# Patient Record
Sex: Female | Born: 1950
Health system: Southern US, Community
[De-identification: ages and names within clinical notes are randomized; demographics above are authoritative.]

## PROBLEM LIST (undated history)

## (undated) DIAGNOSIS — M1990 Unspecified osteoarthritis, unspecified site: Secondary | ICD-10-CM

## (undated) DIAGNOSIS — I10 Essential (primary) hypertension: Secondary | ICD-10-CM

## (undated) DIAGNOSIS — C50919 Malignant neoplasm of unspecified site of unspecified female breast: Secondary | ICD-10-CM

## (undated) DIAGNOSIS — R188 Other ascites: Secondary | ICD-10-CM

## (undated) DIAGNOSIS — R0602 Shortness of breath: Secondary | ICD-10-CM

## (undated) DIAGNOSIS — Z8719 Personal history of other diseases of the digestive system: Secondary | ICD-10-CM

## (undated) DIAGNOSIS — E7800 Pure hypercholesterolemia, unspecified: Secondary | ICD-10-CM

## (undated) DIAGNOSIS — K219 Gastro-esophageal reflux disease without esophagitis: Secondary | ICD-10-CM

## (undated) DIAGNOSIS — E119 Type 2 diabetes mellitus without complications: Secondary | ICD-10-CM

## (undated) DIAGNOSIS — I890 Lymphedema, not elsewhere classified: Secondary | ICD-10-CM

## (undated) DIAGNOSIS — M4850XA Collapsed vertebra, not elsewhere classified, site unspecified, initial encounter for fracture: Secondary | ICD-10-CM

## (undated) DIAGNOSIS — D649 Anemia, unspecified: Secondary | ICD-10-CM

## (undated) HISTORY — DX: Pure hypercholesterolemia, unspecified: E78.00

## (undated) HISTORY — PX: EYE SURGERY: SHX253

## (undated) HISTORY — DX: Malignant neoplasm of unspecified site of unspecified female breast: C50.919

## (undated) HISTORY — DX: Other ascites: R18.8

## (undated) HISTORY — DX: Type 2 diabetes mellitus without complications: E11.9

## (undated) HISTORY — DX: Essential (primary) hypertension: I10

---

## 2001-12-06 HISTORY — PX: UTERINE FIBROID SURGERY: SHX826

## 2003-12-07 HISTORY — PX: MASTECTOMY, PARTIAL: SHX709

## 2003-12-09 DIAGNOSIS — C50912 Malignant neoplasm of unspecified site of left female breast: Secondary | ICD-10-CM | POA: Insufficient documentation

## 2003-12-16 DIAGNOSIS — Z170 Estrogen receptor positive status [ER+]: Secondary | ICD-10-CM | POA: Insufficient documentation

## 2004-07-13 ENCOUNTER — Ambulatory Visit: Admission: RE | Admit: 2004-07-13 | Discharge: 2004-09-17 | Payer: Self-pay | Admitting: *Deleted

## 2004-10-09 ENCOUNTER — Ambulatory Visit: Admission: RE | Admit: 2004-10-09 | Discharge: 2004-10-09 | Payer: Self-pay | Admitting: *Deleted

## 2004-12-14 ENCOUNTER — Ambulatory Visit: Payer: Self-pay | Admitting: Oncology

## 2005-04-06 ENCOUNTER — Ambulatory Visit: Payer: Self-pay | Admitting: Oncology

## 2005-07-14 ENCOUNTER — Ambulatory Visit: Payer: Self-pay | Admitting: Oncology

## 2005-12-08 ENCOUNTER — Ambulatory Visit: Payer: Self-pay | Admitting: Oncology

## 2006-03-17 ENCOUNTER — Ambulatory Visit: Payer: Self-pay | Admitting: Oncology

## 2006-06-03 ENCOUNTER — Ambulatory Visit: Payer: Self-pay | Admitting: Oncology

## 2006-12-08 ENCOUNTER — Ambulatory Visit: Payer: Self-pay | Admitting: Oncology

## 2012-05-23 ENCOUNTER — Ambulatory Visit: Payer: Medicare Other | Admitting: Gynecology

## 2012-05-31 ENCOUNTER — Encounter (HOSPITAL_COMMUNITY): Payer: Self-pay | Admitting: Pharmacy Technician

## 2012-05-31 ENCOUNTER — Encounter: Payer: Self-pay | Admitting: *Deleted

## 2012-06-02 ENCOUNTER — Ambulatory Visit: Payer: Medicare Other | Attending: Gynecology | Admitting: Gynecology

## 2012-06-02 ENCOUNTER — Encounter: Payer: Self-pay | Admitting: Gynecology

## 2012-06-02 ENCOUNTER — Encounter (HOSPITAL_COMMUNITY): Payer: Self-pay

## 2012-06-02 ENCOUNTER — Encounter (HOSPITAL_COMMUNITY)
Admission: RE | Admit: 2012-06-02 | Discharge: 2012-06-02 | Disposition: A | Discharge status: Home / Self Care | Payer: Medicare Other | Source: Ambulatory Visit | Attending: Obstetrics & Gynecology | Admitting: Obstetrics & Gynecology

## 2012-06-02 ENCOUNTER — Ambulatory Visit (HOSPITAL_COMMUNITY)
Admission: RE | Admit: 2012-06-02 | Discharge: 2012-06-02 | Disposition: A | Discharge status: Home / Self Care | Payer: Medicare Other | Source: Ambulatory Visit | Attending: Gynecology | Admitting: Gynecology

## 2012-06-02 VITALS — BP 120/70 | HR 64 | Temp 98.1°F | Resp 16 | Ht 65.35 in | Wt 185.0 lb

## 2012-06-02 DIAGNOSIS — Z90710 Acquired absence of both cervix and uterus: Secondary | ICD-10-CM | POA: Insufficient documentation

## 2012-06-02 DIAGNOSIS — C801 Malignant (primary) neoplasm, unspecified: Secondary | ICD-10-CM | POA: Insufficient documentation

## 2012-06-02 DIAGNOSIS — Z87891 Personal history of nicotine dependence: Secondary | ICD-10-CM | POA: Insufficient documentation

## 2012-06-02 DIAGNOSIS — Z7982 Long term (current) use of aspirin: Secondary | ICD-10-CM | POA: Insufficient documentation

## 2012-06-02 DIAGNOSIS — I447 Left bundle-branch block, unspecified: Secondary | ICD-10-CM | POA: Insufficient documentation

## 2012-06-02 DIAGNOSIS — J910 Malignant pleural effusion: Secondary | ICD-10-CM | POA: Insufficient documentation

## 2012-06-02 DIAGNOSIS — R978 Other abnormal tumor markers: Secondary | ICD-10-CM

## 2012-06-02 DIAGNOSIS — I10 Essential (primary) hypertension: Secondary | ICD-10-CM | POA: Insufficient documentation

## 2012-06-02 DIAGNOSIS — Z8049 Family history of malignant neoplasm of other genital organs: Secondary | ICD-10-CM | POA: Insufficient documentation

## 2012-06-02 DIAGNOSIS — Z01818 Encounter for other preprocedural examination: Secondary | ICD-10-CM | POA: Insufficient documentation

## 2012-06-02 DIAGNOSIS — Z01812 Encounter for preprocedural laboratory examination: Secondary | ICD-10-CM | POA: Insufficient documentation

## 2012-06-02 DIAGNOSIS — Z01810 Encounter for preprocedural cardiovascular examination: Secondary | ICD-10-CM | POA: Insufficient documentation

## 2012-06-02 DIAGNOSIS — E7800 Pure hypercholesterolemia, unspecified: Secondary | ICD-10-CM | POA: Insufficient documentation

## 2012-06-02 DIAGNOSIS — Z853 Personal history of malignant neoplasm of breast: Secondary | ICD-10-CM | POA: Insufficient documentation

## 2012-06-02 DIAGNOSIS — R180 Malignant ascites: Secondary | ICD-10-CM | POA: Insufficient documentation

## 2012-06-02 DIAGNOSIS — J90 Pleural effusion, not elsewhere classified: Secondary | ICD-10-CM | POA: Insufficient documentation

## 2012-06-02 DIAGNOSIS — Z800 Family history of malignant neoplasm of digestive organs: Secondary | ICD-10-CM | POA: Insufficient documentation

## 2012-06-02 DIAGNOSIS — J9819 Other pulmonary collapse: Secondary | ICD-10-CM | POA: Insufficient documentation

## 2012-06-02 HISTORY — DX: Lymphedema, not elsewhere classified: I89.0

## 2012-06-02 HISTORY — DX: Personal history of other diseases of the digestive system: Z87.19

## 2012-06-02 HISTORY — DX: Gastro-esophageal reflux disease without esophagitis: K21.9

## 2012-06-02 HISTORY — DX: Unspecified osteoarthritis, unspecified site: M19.90

## 2012-06-02 HISTORY — DX: Anemia, unspecified: D64.9

## 2012-06-02 HISTORY — DX: Collapsed vertebra, not elsewhere classified, site unspecified, initial encounter for fracture: M48.50XA

## 2012-06-02 HISTORY — DX: Shortness of breath: R06.02

## 2012-06-02 LAB — COMPREHENSIVE METABOLIC PANEL
Alkaline Phosphatase: 78 U/L (ref 39–117)
BUN: 9 mg/dL (ref 6–23)
GFR calc Af Amer: >90 mL/min (ref >90)
Glucose, Bld: 120 mg/dL — ABNORMAL HIGH (ref 70–99)
Potassium: 4.1 mEq/L (ref 3.5–5.1)
Total Protein: 6.5 g/dL (ref 6.0–8.3)

## 2012-06-02 LAB — CBC
HCT: 34.3 % — ABNORMAL LOW (ref 36.0–46.0)
Hemoglobin: 10.7 g/dL — ABNORMAL LOW (ref 12.0–15.0)
MCH: 26.2 pg (ref 26.0–34.0)
MCHC: 31.2 g/dL (ref 30.0–36.0)
MCV: 84.1 fL (ref 78.0–100.0)

## 2012-06-02 LAB — DIFFERENTIAL
Basophils Relative: 0 % (ref 0–1)
Eosinophils Absolute: 0 10*3/uL (ref 0.0–0.7)
Lymphs Abs: 0.5 10*3/uL — ABNORMAL LOW (ref 0.7–4.0)
Neutrophils Relative %: 87 % — ABNORMAL HIGH (ref 43–77)

## 2012-06-02 NOTE — Pre-Procedure Instructions (Signed)
06/02/12 Telford Nab RN made aware of CXR results of 06/02/12.  Will let anesthesia be aware of CXR result.

## 2012-06-02 NOTE — Pre-Procedure Instructions (Signed)
Teach Back Method of teaching used for preop appointment on 06/02/12.   

## 2012-06-02 NOTE — Patient Instructions (Addendum)
20 [name] [name]  06/02/2012   Your procedure is scheduled on:  06/06/12 [id number]  Report to [facility] at 0515 AM.  Call this number if you have problems the morning of surgery: [phone]   Remember:   Do not eat food:After Midnight.  May have clear liquids:until Midnight .  [name]  Take these medicines the morning of surgery with A SIP OF WATER:    Do not wear jewelry, make-up or nail polish.  Do not wear lotions, powders, or perfumes.  Do not shave 48 hours prior to surgery..  Do not bring valuables to the hospital.  Contacts, dentures or bridgework may not be worn into surgery.  Leave suitcase in the car. After surgery it may be brought to your room.  For patients admitted to the hospital, checkout time is 11:00 AM the day of discharge.     Special Instructions: CHG Shower Use Special Wash: 1/2 bottle night before surgery and 1/2 bottle morning of surgery. shower chin to toes with CHG.  Wash face and private parts with regular soap.    Please read over the following fact sheets that you were given: MRSA Information, Incentive Spirometry Fact Sheet, Blood Transfusion Fact sheet, coughing and deep breathing exercises, leg exercises

## 2012-06-02 NOTE — Progress Notes (Signed)
Consult Note: Gyn-Onc   [name] [name] 61 y.o. female  Chief Complaint  Patient presents with  . Ascites, Elevated tumor marker    New pt       HPI:   61-year-old white female seen in consultation request of Dr. Christine [name] of [place] regarding management of new onset of ascites, malignant pleural effusion, and an omental cake. Patient reports that shortly after Memorial Day she developed rapid onset of abdominal distention. A CT scan has been obtained showing pleural effusion, significant abdominal ascites, an omental cake and carcinomatosis. A liter of pleural effusion is been removed as well as a liter and a half from the abdomen. Cytology showed malignant cells consistent with adenocarcinoma. Patient CA 125 value is 656 units per mL CA 27.29 is 208 units per mL CEA is less than 0.3.  The patient is not short of breath although she feels that the abdominal distention is pushing up on her diaphragms. Her appetite has been poor. She has no other GI or GU symptoms.  Is noted that she has a history of breast cancer treated 8 years ago.  Review of Systems:10 point review of systems is negative as noted above.   Vitals: Blood pressure 120/70, pulse 64, temperature 98.1 F (36.7 C), temperature source Oral, resp. rate 16, height 5' 5.35" (1.66 m), weight 185 lb (83.915 kg).  Physical Exam: General : The patient is a healthy woman in no acute distress.  HEENT: normocephalic, extraoccular movements normal; neck is supple without thyromegally  Lynphnodes: Supraclavicular and inguinal nodes not enlarged  Abdomen: Massively distended with ascites. The patient shifting dullness and a fluid wave. I am unable to palpate an omental cake.  Pelvic:  EGBUS: Normal female  Vagina: Normal, no lesions is a polyp in the left vaginal fornix Urethra and Bladder: Normal, non-tender  Cervix: Normal  Uterus: Difficult to outline due to the abdominal distention  Bi-manual examination: nodularity in the  posterior cul-de-sac. Am unable to  detect any other masses although they may be hidden by the patient's abdominal distention. Rectal: normal sphincter tone, no masses, no blood  Lower extremities: 1+ ankle edema bilaterally. No varicosities. 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 I would recommend the patient undergo surgical debulking including omentectomy, total abdominal hysterectomy, bilateral salpingo-oophorectomy, and resection of any other cancer possible. I discussed this with the patient and her husband and their agreement. They understand that postoperatively, once we have are final pathology reports back, that chemotherapy will be also recommended. All her questions are answered. She'll undergo preoperative testing today and we schedule surgery for next Tuesday.  No Known Allergies  Past Medical History  Diagnosis Date  . Breast cancer   . Hypertension   . Hypercholesteremia   . Ascites     Past Surgical History  Procedure Date  . Mastectomy, partial Jan 2005    Left  . Uterine fibroid surgery 2003    Current Outpatient Prescriptions  Medication Sig Dispense Refill  . aspirin 81 MG chewable tablet Chew 81 mg by mouth every other day.      . cetirizine (ZYRTEC) 10 MG tablet Take 10 mg by mouth as needed.      . losartan (COZAAR) 100 MG tablet Take 100 mg by mouth daily with breakfast.      . metoprolol (LOPRESSOR) 50 MG tablet Take 50 mg by   mouth 2 (two) times daily.      . simvastatin (ZOCOR) 40 MG tablet Take 40 mg by mouth every evening.        History   Social History  . Marital Status: Married    Spouse Name: N/A    Number of Children: N/A  . Years of Education: N/A   Occupational History  . Not on file.   Social History Main Topics  . Smoking status: Former Smoker    Quit date: 12/06/2002  . Smokeless  tobacco: Not on file  . Alcohol Use: No  . Drug Use: No  . Sexually Active: Not on file   Other Topics Concern  . Not on file   Social History Narrative  . No narrative on file    Family History  Problem Relation Age of Onset  . Colon cancer Mother   . Uterine cancer Sister   . Uterine cancer Daughter       [name],[name] Klahn L, MD 06/02/2012, 9:57 AM         

## 2012-06-02 NOTE — Patient Instructions (Signed)
We will undertake preoperative testing today. Surgery is scheduled for Tuesday, July 2.

## 2012-06-02 NOTE — Progress Notes (Signed)
06/02/12 Dr Acey Lav made aware of CXR results.  Also aware sat 97 on room air.  Occasional nonproductive cough.  No shortness of breath noted on preop appointment.  No further orders given.

## 2012-06-05 MED ORDER — DEXTROSE 5 % IV SOLN
2.0000 g | INTRAVENOUS | Status: AC
  Administered 2012-06-06: 2 g via INTRAVENOUS
  Filled 2012-06-05 (×2): qty 2

## 2012-06-06 ENCOUNTER — Encounter (HOSPITAL_COMMUNITY): Payer: Self-pay | Admitting: *Deleted

## 2012-06-06 ENCOUNTER — Encounter (HOSPITAL_COMMUNITY): Payer: Self-pay | Admitting: Anesthesiology

## 2012-06-06 ENCOUNTER — Encounter (HOSPITAL_COMMUNITY)
Admission: RE | Disposition: A | Discharge status: Home / Self Care | Payer: Self-pay | Source: Ambulatory Visit | Attending: Obstetrics & Gynecology

## 2012-06-06 ENCOUNTER — Ambulatory Visit (HOSPITAL_COMMUNITY): Payer: Medicare Other | Admitting: Anesthesiology

## 2012-06-06 ENCOUNTER — Inpatient Hospital Stay (HOSPITAL_COMMUNITY)
Admission: RE | Admit: 2012-06-06 | Discharge: 2012-06-09 | DRG: 357 | Disposition: A | Discharge status: Home / Self Care | Payer: Medicare Other | Source: Ambulatory Visit | Attending: Obstetrics & Gynecology | Admitting: Obstetrics & Gynecology

## 2012-06-06 DIAGNOSIS — Z7982 Long term (current) use of aspirin: Secondary | ICD-10-CM

## 2012-06-06 DIAGNOSIS — C482 Malignant neoplasm of peritoneum, unspecified: Principal | ICD-10-CM | POA: Diagnosis present

## 2012-06-06 DIAGNOSIS — Z79899 Other long term (current) drug therapy: Secondary | ICD-10-CM

## 2012-06-06 DIAGNOSIS — Z853 Personal history of malignant neoplasm of breast: Secondary | ICD-10-CM

## 2012-06-06 DIAGNOSIS — E7800 Pure hypercholesterolemia, unspecified: Secondary | ICD-10-CM | POA: Diagnosis present

## 2012-06-06 DIAGNOSIS — E871 Hypo-osmolality and hyponatremia: Secondary | ICD-10-CM | POA: Diagnosis not present

## 2012-06-06 DIAGNOSIS — R180 Malignant ascites: Secondary | ICD-10-CM | POA: Diagnosis present

## 2012-06-06 DIAGNOSIS — C569 Malignant neoplasm of unspecified ovary: Secondary | ICD-10-CM | POA: Diagnosis present

## 2012-06-06 DIAGNOSIS — I10 Essential (primary) hypertension: Secondary | ICD-10-CM | POA: Diagnosis present

## 2012-06-06 HISTORY — PX: SALPINGOOPHORECTOMY: SHX82

## 2012-06-06 HISTORY — PX: LAPAROTOMY: SHX154

## 2012-06-06 HISTORY — PX: APPENDECTOMY: SHX54

## 2012-06-06 SURGERY — LAPAROTOMY, EXPLORATORY
Anesthesia: General | Wound class: Clean Contaminated

## 2012-06-06 MED ORDER — PROPOFOL 10 MG/ML IV BOLUS
INTRAVENOUS | Status: DC | PRN
  Administered 2012-06-06: 100 mg via INTRAVENOUS

## 2012-06-06 MED ORDER — PROMETHAZINE HCL 25 MG/ML IJ SOLN: 6.2500 mg | INTRAMUSCULAR | Status: DC | PRN

## 2012-06-06 MED ORDER — ZOLPIDEM TARTRATE 5 MG PO TABS: 5.0000 mg | ORAL_TABLET | Freq: Every evening | ORAL | Status: DC | PRN

## 2012-06-06 MED ORDER — NEOSTIGMINE METHYLSULFATE 1 MG/ML IJ SOLN
INTRAMUSCULAR | Status: DC | PRN
  Administered 2012-06-06: 4 mg via INTRAVENOUS

## 2012-06-06 MED ORDER — DEXAMETHASONE SODIUM PHOSPHATE 10 MG/ML IJ SOLN
INTRAMUSCULAR | Status: DC | PRN
  Administered 2012-06-06: 10 mg via INTRAVENOUS

## 2012-06-06 MED ORDER — DIPHENHYDRAMINE HCL 12.5 MG/5ML PO ELIX: 12.5000 mg | ORAL_SOLUTION | Freq: Four times a day (QID) | ORAL | Status: DC | PRN

## 2012-06-06 MED ORDER — GLYCOPYRROLATE 0.2 MG/ML IJ SOLN
INTRAMUSCULAR | Status: DC | PRN
  Administered 2012-06-06: .5 mg via INTRAVENOUS

## 2012-06-06 MED ORDER — LOSARTAN POTASSIUM 50 MG PO TABS
100.0000 mg | ORAL_TABLET | Freq: Every day | ORAL | Status: DC
  Administered 2012-06-07 – 2012-06-09 (×3): 100 mg via ORAL
  Filled 2012-06-06 (×3): qty 2

## 2012-06-06 MED ORDER — KCL IN DEXTROSE-NACL 20-5-0.45 MEQ/L-%-% IV SOLN
INTRAVENOUS | Status: AC
  Administered 2012-06-06: 1000 mL
  Filled 2012-06-06: qty 1000

## 2012-06-06 MED ORDER — ONDANSETRON HCL 4 MG/2ML IJ SOLN
4.0000 mg | Freq: Four times a day (QID) | INTRAMUSCULAR | Status: DC | PRN
  Administered 2012-06-07: 4 mg via INTRAVENOUS
  Filled 2012-06-06: qty 2

## 2012-06-06 MED ORDER — KCL IN DEXTROSE-NACL 20-5-0.45 MEQ/L-%-% IV SOLN
INTRAVENOUS | Status: DC
Start: 2012-06-06 — End: 2012-06-07
  Administered 2012-06-06 – 2012-06-07 (×2): via INTRAVENOUS
  Filled 2012-06-06 (×4): qty 1000

## 2012-06-06 MED ORDER — PHENOL 1.4 % MT LIQD
1.0000 | OROMUCOSAL | Status: DC | PRN
  Filled 2012-06-06: qty 177

## 2012-06-06 MED ORDER — HETASTARCH-ELECTROLYTES 6 % IV SOLN
INTRAVENOUS | Status: DC | PRN
  Administered 2012-06-06: 08:00:00 via INTRAVENOUS

## 2012-06-06 MED ORDER — FENTANYL CITRATE 0.05 MG/ML IJ SOLN
INTRAMUSCULAR | Status: DC | PRN
  Administered 2012-06-06: 50 ug via INTRAVENOUS
  Administered 2012-06-06: 100 ug via INTRAVENOUS
  Administered 2012-06-06 (×2): 50 ug via INTRAVENOUS

## 2012-06-06 MED ORDER — HYDROMORPHONE 0.3 MG/ML IV SOLN
INTRAVENOUS | Status: DC
  Administered 2012-06-06: 10:00:00 via INTRAVENOUS
  Administered 2012-06-06: 0.3 mg via INTRAVENOUS
  Administered 2012-06-06: 0.6 mg via INTRAVENOUS
  Administered 2012-06-06 – 2012-06-07 (×2): 0.3 mg via INTRAVENOUS

## 2012-06-06 MED ORDER — ENOXAPARIN SODIUM 40 MG/0.4ML ~~LOC~~ SOLN
40.0000 mg | SUBCUTANEOUS | Status: AC
  Administered 2012-06-06: 40 mg via SUBCUTANEOUS

## 2012-06-06 MED ORDER — HYDROMORPHONE HCL PF 1 MG/ML IJ SOLN
INTRAMUSCULAR | Status: AC
  Filled 2012-06-06: qty 1

## 2012-06-06 MED ORDER — SODIUM CHLORIDE 0.9 % IJ SOLN: 9.0000 mL | INTRAMUSCULAR | Status: DC | PRN

## 2012-06-06 MED ORDER — ENOXAPARIN SODIUM 40 MG/0.4ML ~~LOC~~ SOLN
SUBCUTANEOUS | Status: AC
  Filled 2012-06-06: qty 0.4

## 2012-06-06 MED ORDER — MAGNESIUM HYDROXIDE 400 MG/5ML PO SUSP
30.0000 mL | Freq: Three times a day (TID) | ORAL | Status: DC
  Administered 2012-06-06 – 2012-06-07 (×2): 30 mL via ORAL
  Filled 2012-06-06 (×2): qty 30

## 2012-06-06 MED ORDER — SUCCINYLCHOLINE CHLORIDE 20 MG/ML IJ SOLN
INTRAMUSCULAR | Status: DC | PRN
  Administered 2012-06-06: 100 mg via INTRAVENOUS

## 2012-06-06 MED ORDER — ONDANSETRON HCL 4 MG PO TABS: 4.0000 mg | ORAL_TABLET | Freq: Four times a day (QID) | ORAL | Status: DC | PRN

## 2012-06-06 MED ORDER — NALOXONE HCL 0.4 MG/ML IJ SOLN: 0.4000 mg | INTRAMUSCULAR | Status: DC | PRN

## 2012-06-06 MED ORDER — DIPHENHYDRAMINE HCL 50 MG/ML IJ SOLN: 12.5000 mg | Freq: Four times a day (QID) | INTRAMUSCULAR | Status: DC | PRN

## 2012-06-06 MED ORDER — HYDROMORPHONE 0.3 MG/ML IV SOLN
INTRAVENOUS | Status: AC
  Filled 2012-06-06: qty 25

## 2012-06-06 MED ORDER — BUPIVACAINE LIPOSOME 1.3 % IJ SUSP
20.0000 mL | INTRAMUSCULAR | Status: AC
  Administered 2012-06-06: 40 mL
  Filled 2012-06-06: qty 20

## 2012-06-06 MED ORDER — ONDANSETRON HCL 4 MG/2ML IJ SOLN
INTRAMUSCULAR | Status: DC | PRN
  Administered 2012-06-06: 4 mg via INTRAVENOUS

## 2012-06-06 MED ORDER — OXYCODONE-ACETAMINOPHEN 5-325 MG PO TABS: 1.0000 | ORAL_TABLET | ORAL | Status: DC | PRN

## 2012-06-06 MED ORDER — HYDROMORPHONE HCL PF 1 MG/ML IJ SOLN: 0.2500 mg | INTRAMUSCULAR | Status: DC | PRN

## 2012-06-06 MED ORDER — CISATRACURIUM BESYLATE (PF) 10 MG/5ML IV SOLN
INTRAVENOUS | Status: DC | PRN
  Administered 2012-06-06 (×3): 4 mg via INTRAVENOUS

## 2012-06-06 MED ORDER — 0.9 % SODIUM CHLORIDE (POUR BTL) OPTIME
TOPICAL | Status: DC | PRN
  Administered 2012-06-06: 2000 mL

## 2012-06-06 MED ORDER — ONDANSETRON HCL 4 MG/2ML IJ SOLN: 4.0000 mg | Freq: Four times a day (QID) | INTRAMUSCULAR | Status: DC | PRN

## 2012-06-06 MED ORDER — METOPROLOL TARTRATE 50 MG PO TABS
50.0000 mg | ORAL_TABLET | Freq: Two times a day (BID) | ORAL | Status: DC
  Administered 2012-06-07 – 2012-06-09 (×5): 50 mg via ORAL
  Filled 2012-06-06 (×7): qty 1

## 2012-06-06 MED ORDER — LOSARTAN POTASSIUM 50 MG PO TABS: 100.0000 mg | ORAL_TABLET | Freq: Every day | ORAL | Status: DC

## 2012-06-06 MED ORDER — LACTATED RINGERS IV SOLN
INTRAVENOUS | Status: DC | PRN
  Administered 2012-06-06: 08:00:00 via INTRAVENOUS

## 2012-06-06 MED ORDER — MIDAZOLAM HCL 5 MG/5ML IJ SOLN
INTRAMUSCULAR | Status: DC | PRN
  Administered 2012-06-06: 2 mg via INTRAVENOUS

## 2012-06-06 MED ORDER — LACTATED RINGERS IV SOLN
INTRAVENOUS | Status: DC
Start: 2012-06-06 — End: 2012-06-06
  Administered 2012-06-06 (×2): via INTRAVENOUS

## 2012-06-06 SURGICAL SUPPLY — 49 items
ATTRACTOMAT 16X20 MAGNETIC DRP (DRAPES) ×4 IMPLANT
BAG URINE DRAINAGE (UROLOGICAL SUPPLIES) ×3 IMPLANT
BLADE EXTENDED COATED 6.5IN (ELECTRODE) ×6 IMPLANT
CANISTER SUCTION 2500CC (MISCELLANEOUS) ×4 IMPLANT
CHLORAPREP W/TINT 10.5 ML (MISCELLANEOUS) ×4 IMPLANT
CLIP TI MEDIUM LARGE 6 (CLIP) ×19 IMPLANT
CLOTH BEACON ORANGE TIMEOUT ST (SAFETY) ×4 IMPLANT
COVER SURGICAL LIGHT HANDLE (MISCELLANEOUS) ×4 IMPLANT
DRAPE UTILITY 15X26 (DRAPE) ×4 IMPLANT
DRAPE UTILITY XL STRL (DRAPES) ×4 IMPLANT
DRAPE WARM FLUID 44X44 (DRAPE) ×4 IMPLANT
DRSG TELFA 4X14 ISLAND ADH (GAUZE/BANDAGES/DRESSINGS) ×2 IMPLANT
ELECT BLADE 6.5 EXT (BLADE) ×4 IMPLANT
ELECT REM PT RETURN 9FT ADLT (ELECTROSURGICAL) ×4
ELECTRODE REM PT RTRN 9FT ADLT (ELECTROSURGICAL) ×3 IMPLANT
GAUZE SPONGE 4X4 16PLY XRAY LF (GAUZE/BANDAGES/DRESSINGS) ×2 IMPLANT
GLOVE BIO SURGEON STRL SZ 6.5 (GLOVE) ×4 IMPLANT
GLOVE BIO SURGEON STRL SZ7.5 (GLOVE) ×12 IMPLANT
GLOVE BIOGEL M STRL SZ7.5 (GLOVE) ×19 IMPLANT
GOWN PREVENTION PLUS LG XLONG (DISPOSABLE) ×5 IMPLANT
GOWN PREVENTION PLUS XLARGE (GOWN DISPOSABLE) ×4 IMPLANT
GOWN STRL NON-REIN LRG LVL3 (GOWN DISPOSABLE) ×4 IMPLANT
GOWN STRL REIN XL XLG (GOWN DISPOSABLE) ×4 IMPLANT
NS IRRIG 1000ML POUR BTL (IV SOLUTION) ×14 IMPLANT
PACK ABDOMINAL WL (CUSTOM PROCEDURE TRAY) ×4 IMPLANT
SEALER TISSUE X1 CVD JAW (INSTRUMENTS) ×3 IMPLANT
SHEET LAVH (DRAPES) ×4 IMPLANT
SPONGE LAP 18X18 X RAY DECT (DISPOSABLE) ×7 IMPLANT
STAPLER SKIN PROX WIDE 3.9 (STAPLE) ×4 IMPLANT
STAPLER VISISTAT 35W (STAPLE) ×4 IMPLANT
SUCTION POOLE TIP (SUCTIONS) ×2 IMPLANT
SUT ETHILON 1 LR 30 (SUTURE) IMPLANT
SUT PDS AB 1 CTXB1 36 (SUTURE) ×8 IMPLANT
SUT SILK 2 0 (SUTURE) ×4
SUT SILK 2 0 30  PSL (SUTURE) ×1
SUT SILK 2 0 30 PSL (SUTURE) ×1 IMPLANT
SUT SILK 2-0 18XBRD TIE 12 (SUTURE) ×3 IMPLANT
SUT VIC AB 0 CT1 36 (SUTURE) ×13 IMPLANT
SUT VIC AB 2-0 CT2 27 (SUTURE) ×27 IMPLANT
SUT VIC AB 2-0 SH 27 (SUTURE) ×20
SUT VIC AB 2-0 SH 27X BRD (SUTURE) ×17 IMPLANT
SUT VIC AB 3-0 CTX 36 (SUTURE) ×1 IMPLANT
SUT VICRYL 2 0 18  UND BR (SUTURE) ×1
SUT VICRYL 2 0 18 UND BR (SUTURE) ×3 IMPLANT
SYR 20CC LL (SYRINGE) ×1 IMPLANT
TOWEL OR 17X26 10 PK STRL BLUE (TOWEL DISPOSABLE) ×4 IMPLANT
TOWEL OR NON WOVEN STRL DISP B (DISPOSABLE) ×4 IMPLANT
TRAY FOLEY CATH 14FRSI W/METER (CATHETERS) ×4 IMPLANT
WATER STERILE IRR 1500ML POUR (IV SOLUTION) ×3 IMPLANT

## 2012-06-06 NOTE — Anesthesia Postprocedure Evaluation (Signed)
  Anesthesia Post-op Note  Patient: [name] [name]  Procedure(s) Performed: Procedure(s) (LRB): EXPLORATORY LAPAROTOMY (N/A) OMENTECTOMY () APPENDECTOMY () SALPINGO OOPHERECTOMY (Left)  Patient Location: PACU  Anesthesia Type: General  Level of Consciousness: awake and alert   Airway and Oxygen Therapy: Patient Spontanous Breathing  Post-op Pain: mild  Post-op Assessment: Post-op Vital signs reviewed, Patient's Cardiovascular Status Stable, Respiratory Function Stable, Patent Airway and No signs of Nausea or vomiting  Post-op Vital Signs: stable  Complications: No apparent anesthesia complications

## 2012-06-06 NOTE — Op Note (Signed)
[name] [name]  female MEDICAL RECORD [id number] DATE OF BIRTH: January 13, 1951 PHYSICIAN: De Blanch, M.D  DATE OF PROCEDURE: 06/06/2012    OPERATIVE REPORT  PREOPERATIVE DIAGNOSIS: Malignant ascites and omental cake, rule out ovarian cancer  POSTOPERATIVE DIAGNOSIS: Same. 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  SURGEON: De Blanch, M.D ASSISTANT: Antionette Char M.D., Telford Nab RN ANESTHESIA: Gen. with oral tracheal tube ESTIMATED BLOOD LOSS: 200 mL. Patient had 6.5 L of ascites.  SURGICAL FINDINGS: At the time of exploratory laparotomy the patient was found to have 6.5 L of straw-colored ascites. The omentum was entirely replaced by metastatic disease. The omentum was removed in its entirety. Both diaphragms had extensive metastatic disease on the surfaces. There was also considerable amount of disease involving the peritoneum overlying the pelvic sidewalls and bladder flap. The left tube and ovary appeared relatively normal. The right tube and ovary were not enlarged although encased with peritoneal metastases. A considerable amount of metastatic disease around the appendix and terminal ileum area  At the completion surgical procedure the patient was suboptimally debulked with disease remaining on the diaphragm, pelvic peritoneum, and the small bowel mesentery (ileum)  PROCEDURE: Patient was brought to the operating room and after satisfactory attainment of general anesthesia, was placed in the modified lithotomy position in [place] stirrups. The anterior abdominal wall vagina and perineum were prepped, a Foley catheter was inserted and the patient was draped. A timeout was taken. Antibiotics were administered. The abdomen was entered through a midline incision which extended a number of centimeters above the umbilicus. Ascites was aspirated. The abdomen and pelvis were explored with the  above-noted findings. A Bookwalter retractor was positioned and the omentum exposed. The omentum was removed from the transverse colon using Bovie cautery and the super jaw. Adhesions in the splenic flexure were taken down and the entire omentum removed from the transverse colon. Additional diseases involving the omentum between the stomach and transverse colon. The lesser sac was opened and and the omentum removed from the greater curvature of the stomach. The surgical site was hemostatic.  The retractor was repositioned and the small bowel packed out of the pelvis. The pelvis was explored and due to the extensive amount of disease involving the rectosigmoid colon and all pelvic peritoneal surfaces it was decided that even if we resected all pelvic disease she still would have considerable disease on her diaphragms and small bowel mesentery and therefore only the left ovary was removed. The retroperitoneal space on the left was opened, the ovarian vessels skeletonized ureter identified. The ovarian vessels were then divided using the super jaw and the remainder the ovary and tube removed by dividing at the uterine cornu.  Attention was turned to the appendix which was extensively involved with disease. The mesoappendix was divided with the super jaw. The base the appendix was crossclamped divided and suture ligated. Pursestring sutures placed around the cecum and the appendix inverted. The abdomen and pelvis were reexplored and found to be hemostatic. The peritoneal cavity was irrigated. Packs and retractors removed. The anterior bowel wall was closed in layers. The first being a running mass closure using #1 PDS. Subcutaneous tissue was irrigated hemostasis achieved with cautery subcutaneous tissue was reapproximated with interrupted 3-0 Vicryl sutures. Skin was closed with skin staples. A dressing was applied. The patient was awakened from anesthesia and taken to the recovery room in satisfactory condition.  Sponge needle and isthmic counts were correct x2.   De Blanch, M.D

## 2012-06-06 NOTE — Interval H&P Note (Signed)
History and Physical Interval Note:  06/06/2012 7:06 AM  [name] [name]  has presented today for surgery, with the diagnosis of oomental cake  The various methods of treatment have been discussed with the patient and family. After consideration of risks, benefits and other options for treatment, the patient has consented to  Procedure(s) (LRB): EXPLORATORY LAPAROTOMY (N/A) HYSTERECTOMY ABDOMINAL (N/A) BILATERAL SALPINGECTOMY (N/A) as a surgical intervention .  The patient's history has been reviewed, patient examined, no change in status, stable for surgery.  I have reviewed the patients' chart and labs.  Questions were answered to the patient's satisfaction.     CLARKE-PEARSON,Glady Ouderkirk L

## 2012-06-06 NOTE — Progress Notes (Signed)
Utilization review completed.  

## 2012-06-06 NOTE — H&P (View-Only) (Signed)
Consult Note: Gyn-Onc   [name] [name] 61 y.o. female  Chief Complaint  Patient presents with  . Ascites, Elevated tumor marker    New pt       HPI:   61 year old white female seen in consultation request of Dr. Gery Pray of [place] regarding management of new onset of ascites, malignant pleural effusion, and an omental cake. Patient reports that shortly after Memorial Day she developed rapid onset of abdominal distention. A CT scan has been obtained showing pleural effusion, significant abdominal ascites, an omental cake and carcinomatosis. A liter of pleural effusion is been removed as well as a liter and a half from the abdomen. Cytology showed malignant cells consistent with adenocarcinoma. Patient CA 125 value is 656 units per mL CA 27.29 is 208 units per mL CEA is less than 0.3.  The patient is not short of breath although she feels that the abdominal distention is pushing up on her diaphragms. Her appetite has been poor. She has no other GI or GU symptoms.  Is noted that she has a history of breast cancer treated 8 years ago.  Review of Systems:10 point review of systems is negative as noted above.   Vitals: Blood pressure 120/70, pulse 64, temperature 98.1 F (36.7 C), temperature source Oral, resp. rate 16, height 5' 5.35" (1.66 m), weight 185 lb (83.915 kg).  Physical Exam: General : The patient is a healthy woman in no acute distress.  HEENT: normocephalic, extraoccular movements normal; neck is supple without thyromegally  Lynphnodes: Supraclavicular and inguinal nodes not enlarged  Abdomen: Massively distended with ascites. The patient shifting dullness and a fluid wave. I am unable to palpate an omental cake.  Pelvic:  EGBUS: Normal female  Vagina: Normal, no lesions is a polyp in the left vaginal fornix Urethra and Bladder: Normal, non-tender  Cervix: Normal  Uterus: Difficult to outline due to the abdominal distention  Bi-manual examination: nodularity in the  posterior cul-de-sac. Am unable to  detect any other masses although they may be hidden by the patient's abdominal distention. Rectal: normal sphincter tone, no masses, no blood  Lower extremities: 1+ ankle edema bilaterally. No varicosities. 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 I would recommend the patient undergo surgical debulking including omentectomy, total abdominal hysterectomy, bilateral salpingo-oophorectomy, and resection of any other cancer possible. I discussed this with the patient and her husband and their agreement. They understand that postoperatively, once we have are final pathology reports back, that chemotherapy will be also recommended. All her questions are answered. She'll undergo preoperative testing today and we schedule surgery for next Tuesday.  No Known Allergies  Past Medical History  Diagnosis Date  . Breast cancer   . Hypertension   . Hypercholesteremia   . Ascites     Past Surgical History  Procedure Date  . Mastectomy, partial Jan 2005    Left  . Uterine fibroid surgery 2003    Current Outpatient Prescriptions  Medication Sig Dispense Refill  . aspirin 81 MG chewable tablet Chew 81 mg by mouth every other day.      . cetirizine (ZYRTEC) 10 MG tablet Take 10 mg by mouth as needed.      [name] losartan (COZAAR) 100 MG tablet Take 100 mg by mouth daily with breakfast.      . metoprolol (LOPRESSOR) 50 MG tablet Take 50 mg by  mouth 2 (two) times daily.      . simvastatin (ZOCOR) 40 MG tablet Take 40 mg by mouth every evening.        History   Social History  . Marital Status: Married    Spouse Name: N/A    Number of Children: N/A  . Years of Education: N/A   Occupational History  . Not on file.   Social History Main Topics  . Smoking status: Former Smoker    Quit date: 12/06/2002  . Smokeless  tobacco: Not on file  . Alcohol Use: No  . Drug Use: No  . Sexually Active: Not on file   Other Topics Concern  . Not on file   Social History Narrative  . No narrative on file    Family History  Problem Relation Age of Onset  . Colon cancer Mother   . Uterine cancer Sister   . Uterine cancer Daughter       Jeannette Corpus, MD 06/02/2012, 9:57 AM

## 2012-06-06 NOTE — Plan of Care (Signed)
Problem: Diagnosis - Type of Surgery Goal: General Surgical Patient Education (See Patient Education module for education specifics) Outcome: Progressing progressing   Problem: Phase I Progression Outcomes Goal: Pain controlled with appropriate interventions Outcome: Progressing progressing  Goal: Incision/dressings dry and intact Outcome: Progressing progressing  Goal: Sutures/staples intact Outcome: Progressing progressing  Goal: Tubes/drains patent Outcome: Progressing progressing  Goal: Initial discharge plan identified Outcome: Progressing progressing  Goal: Vital signs/hemodynamically stable Outcome: Progressing progressing

## 2012-06-06 NOTE — Transfer of Care (Signed)
Immediate Anesthesia Transfer of Care Note  Patient: [name] [name]  Procedure(s) Performed: Procedure(s) (LRB): EXPLORATORY LAPAROTOMY (N/A) OMENTECTOMY () APPENDECTOMY () SALPINGO OOPHERECTOMY (Left)  Patient Location: PACU  Anesthesia Type: General  Level of Consciousness: awake, alert , oriented and patient cooperative  Airway & Oxygen Therapy: Patient Spontanous Breathing and Patient connected to face mask oxygen  Post-op Assessment: Report given to PACU RN and Post -op Vital signs reviewed and stable  Post vital signs: Reviewed and stable  Complications: No apparent anesthesia complications

## 2012-06-06 NOTE — Preoperative (Signed)
Beta Blockers   Reason not to administer Beta Blockers:Not Applicable 

## 2012-06-06 NOTE — Anesthesia Preprocedure Evaluation (Addendum)
Anesthesia Evaluation  Patient identified by MRN, date of birth, ID band Patient awake    Reviewed: Allergy & Precautions, H&P , NPO status , Patient's Chart, lab work & pertinent test results  Airway Mallampati: II TM Distance: >3 FB Neck ROM: Full    Dental No notable dental hx.    Pulmonary shortness of breath,  breath sounds clear to auscultation  Pulmonary exam normal       Cardiovascular hypertension, Pt. on medications and Pt. on home beta blockers Rhythm:Regular Rate:Normal     Neuro/Psych negative neurological ROS  negative psych ROS   GI/Hepatic Neg liver ROS, hiatal hernia, GERD-  ,  Endo/Other  negative endocrine ROS  Renal/GU negative Renal ROS  negative genitourinary   Musculoskeletal negative musculoskeletal ROS (+)   Abdominal   Peds negative pediatric ROS (+)  Hematology negative hematology ROS (+)   Anesthesia Other Findings   Reproductive/Obstetrics negative OB ROS                           Anesthesia Physical Anesthesia Plan  ASA: II  Anesthesia Plan: General   Post-op Pain Management:    Induction: Intravenous  Airway Management Planned:   Additional Equipment:   Intra-op Plan:   Post-operative Plan: Extubation in OR  Informed Consent: I have reviewed the patients History and Physical, chart, labs and discussed the procedure including the risks, benefits and alternatives for the proposed anesthesia with the patient or authorized representative who has indicated his/her understanding and acceptance.   Dental advisory given  Plan Discussed with: CRNA  Anesthesia Plan Comments:         Anesthesia Quick Evaluation

## 2012-06-07 ENCOUNTER — Encounter (HOSPITAL_COMMUNITY): Payer: Self-pay | Admitting: Gynecology

## 2012-06-07 DIAGNOSIS — R180 Malignant ascites: Secondary | ICD-10-CM | POA: Diagnosis present

## 2012-06-07 LAB — BASIC METABOLIC PANEL
BUN: 5 mg/dL — ABNORMAL LOW (ref 6–23)
CO2: 29 mEq/L (ref 19–32)
Chloride: 98 mEq/L (ref 96–112)
Chloride: 93 mEq/L — ABNORMAL LOW (ref 96–112)
Creatinine, Ser: 0.67 mg/dL (ref 0.50–1.10)
GFR calc Af Amer: >90 mL/min (ref >90)
GFR calc Af Amer: >90 mL/min (ref >90)
Potassium: 4.1 mEq/L (ref 3.5–5.1)
Potassium: 4.3 mEq/L (ref 3.5–5.1)
Sodium: 128 mEq/L — ABNORMAL LOW (ref 135–145)

## 2012-06-07 LAB — CBC
HCT: 29 % — ABNORMAL LOW (ref 36.0–46.0)
Hemoglobin: 9.2 g/dL — ABNORMAL LOW (ref 12.0–15.0)
RDW: 14.8 % (ref 11.5–15.5)
WBC: 10.3 10*3/uL (ref 4.0–10.5)

## 2012-06-07 MED ORDER — PANTOPRAZOLE SODIUM 40 MG PO TBEC
40.0000 mg | DELAYED_RELEASE_TABLET | Freq: Every day | ORAL | Status: DC
  Administered 2012-06-07 – 2012-06-08 (×2): 40 mg via ORAL
  Filled 2012-06-07 (×3): qty 1

## 2012-06-07 MED ORDER — ENOXAPARIN (LOVENOX) PATIENT EDUCATION KIT
PACK | Freq: Once | Status: AC
  Administered 2012-06-07: 09:00:00
  Filled 2012-06-07: qty 1

## 2012-06-07 MED ORDER — ENOXAPARIN SODIUM 40 MG/0.4ML ~~LOC~~ SOLN
40.0000 mg | SUBCUTANEOUS | Status: DC
  Administered 2012-06-07 – 2012-06-08 (×2): 40 mg via SUBCUTANEOUS
  Filled 2012-06-07 (×3): qty 0.4

## 2012-06-07 MED ORDER — CALCIUM CARBONATE ANTACID 500 MG PO CHEW
1.0000 | CHEWABLE_TABLET | Freq: Three times a day (TID) | ORAL | Status: DC | PRN
  Administered 2012-06-07: 200 mg via ORAL
  Filled 2012-06-07: qty 1

## 2012-06-07 NOTE — Progress Notes (Signed)
1 Day Post-Op Procedure(s) (LRB): EXPLORATORY LAPAROTOMY (N/A) OMENTECTOMY () APPENDECTOMY () SALPINGO OOPHERECTOMY (Left)  Subjective: Patient reports no complaints, minimal pain, and tolerating PO.  Denies nausea, vomiting, and flatus.   Objective: Vital signs in last 24 hours: Temp:  [97.6 F (36.4 C)-98.7 F (37.1 C)] 98.7 F (37.1 C) (07/03 0556) Pulse Rate:  [79-93] 80  (07/03 0556) Resp:  [14-26] 26  (07/03 0556) BP: (99-133)/(53-81) 123/81 mmHg (07/03 0556) SpO2:  [91 %-99 %] 96 % (07/03 0556) FiO2 (%):  [36 %] 36 % (07/02 1039) Weight:  [183 lb 6.4 oz (83.19 kg)] 183 lb 6.4 oz (83.19 kg) (07/02 1140) Last BM Date: 06/06/12  Intake/Output from previous day: 07/02 0701 - 07/03 0700 In: 4061.3 [P.O.:480; I.V.:3081.3; IV Piggyback:500] Out: 6050 [Urine:1050; Blood:300]  Physical Examination: General: alert, cooperative and no distress Resp: clear to auscultation bilaterally Cardio: regular rate and rhythm, S1, S2 normal, no murmur, click, rub or gallop GI: incision: clean, dry and intact and active bowel sounds noted in the upper quadrants with hypoactive bowel sounds in the lower abdominal quadrants.  Abd soft, nontender, no masses, no organomegaly. Extremities: extremities normal, atraumatic, no cyanosis or edema  Labs: WBC/Hgb/Hct/Plts:  10.3/9.2/29.0/460 (07/03 0415) BUN/Cr/glu/ALT/AST/amyl/lip:  5/0.66/--/--/--/--/-- (07/03 0415)  Assessment: 61 y.o. s/p Procedure(s): EXPLORATORY LAPAROTOMY OMENTECTOMY APPENDECTOMY SALPINGO OOPHERECTOMY: stable Pain:  Pain is well-controlled on PCA.  CV: Hypertension: Stable controlled. Current treatment:  losartran (Cozaar) and metoprolol (Lopressor, Toprol).  GI:  Tolerating po: Yes     FEN: Hyponatremia: Encouraging PO intake.  Recheck Bmet at 14:00  Prophylaxis: pharmacologic prophylaxis (with any of the following: enoxaparin (Lovenox) 40mg  SQ 2 hours prior to surgery then every day) and intermittent pneumatic  compression boots.  Plan: Advance diet Encourage ambulation Advance to PO medication Discontinue IV fluids Check Bmet at 14:00 Begin Lovenox injections   LOS: 1 day    Delta Pichon DEAL 06/07/2012, 8:55 AM

## 2012-06-07 NOTE — Care Management Note (Unsigned)
    Page 1 of 1   06/07/2012     2:43:55 PM   CARE MANAGEMENT NOTE 06/07/2012  Patient:  [facility]   Account Number:  [id number]  Date Initiated:  06/07/2012  Documentation initiated by:  Lanier Clam  Subjective/Objective Assessment:   ADMITTED W/ABDOMINAL ASCITES. UJ:[id number] CA,BREAST CA,PARTIAL L  MASTECTOMY     Action/Plan:   FROM HOME   Anticipated DC Date:  06/10/2012   Anticipated DC Plan:  HOME/SELF CARE      DC Planning Services  CM consult      Choice offered to / List presented to:             Status of service:  In process, will continue to follow Medicare Important Message given?   (If response is "NO", the following Medicare IM given date fields will be blank) Date Medicare IM given:   Date Additional Medicare IM given:    Discharge Disposition:    Per UR Regulation:  Reviewed for med. necessity/level of care/duration of stay  If discussed at Long Length of Stay Meetings, dates discussed:    Comments:  06/07/12 Lubna Stegeman RN,BSN NCM [phone] POD#1 EXP LAP, TOTAL OMENTECTOMY,L SALPINGO OOPHORECTOMY

## 2012-06-07 NOTE — Progress Notes (Signed)
Pt and her husband given Lovenox kit and taught how to administer Lovenox.  Pt's husband demonstrated and administered pt's morning dose of Lovenox without difficulty.  No questions at this time from pt.

## 2012-06-08 LAB — BASIC METABOLIC PANEL
CO2: 30 mEq/L (ref 19–32)
Calcium: 8 mg/dL — ABNORMAL LOW (ref 8.4–10.5)
Chloride: 96 mEq/L (ref 96–112)
Glucose, Bld: 114 mg/dL — ABNORMAL HIGH (ref 70–99)
Potassium: 4.3 mEq/L (ref 3.5–5.1)
Sodium: 131 mEq/L — ABNORMAL LOW (ref 135–145)

## 2012-06-08 MED ORDER — BISACODYL 10 MG RE SUPP
10.0000 mg | Freq: Once | RECTAL | Status: AC
  Administered 2012-06-08: 10 mg via RECTAL
  Filled 2012-06-08: qty 1

## 2012-06-08 NOTE — Progress Notes (Signed)
2 Days Post-Op Procedure(s) (LRB): EXPLORATORY LAPAROTOMY (N/A) OMENTECTOMY () APPENDECTOMY () SALPINGO OOPHERECTOMY (Left)  Subjective: Patient reports no complaints.  Denies nausea, vomiting, flatus, and having a bowel movement.  Nursing staff reporting that patient had 800 cc of green emesis last night.  Tolerating a full liquid diet at this time.  Denies pain.  Objective: Vital signs in last 24 hours: Temp:  [97.8 F (36.6 C)-98.3 F (36.8 C)] 97.8 F (36.6 C) (07/04 0600) Pulse Rate:  [80-101] 80  (07/04 0600) Resp:  [20-22] 20  (07/04 0600) BP: (95-119)/(62-75) 103/65 mmHg (07/04 0600) SpO2:  [88 %-92 %] 90 % (07/04 0600) Last BM Date: 06/06/12  Intake/Output from previous day: 07/03 0701 - 07/04 0700 In: 1020 [P.O.:1020] Out: 2101 [Urine:1300; Emesis/NG output:800; Stool:1]  Physical Examination: General: alert, cooperative and no distress Resp: clear to auscultation bilaterally Cardio: regular rate and rhythm, S1, S2 normal, no murmur, click, rub or gallop GI: soft, non-tender; bowel sounds normal; no masses,  no organomegaly and incision: midline incision with staples clean, dry, and intact Extremities: extremities normal, atraumatic, no cyanosis or edema  Labs:   BUN/Cr/glu/ALT/AST/amyl/lip:  6/0.79/--/--/--/--/-- (07/04 0434)   Assessment:  61 y.o. s/p Procedure(s): EXPLORATORY LAPAROTOMY OMENTECTOMY APPENDECTOMY SALPINGO OOPHERECTOMY: stable Pain:  Pain is well-controlled on oral medications.  CV: Hypertension: Stable controlled. Current treatment:  losartran (Cozaar) and metoprolol (Lopressor, Toprol).  GI:  Tolerating po: Yes     FEN: Hyponatremia resolving: 131  Prophylaxis: pharmacologic prophylaxis (with any of the following: enoxaparin (Lovenox) 40mg  SQ 2 hours prior to surgery then every day) and intermittent pneumatic compression boots.  Plan: Advance diet Encourage ambulation   LOS: 2 days    [name], [name] DEAL 06/08/2012, 8:11  AM

## 2012-06-08 NOTE — Progress Notes (Signed)
Pt BP 96/59, HR 93. On call MD for Dr.Jackson- Christell Constant called about scheduled metoprolol 50 mg, if it should be held or give. MD said to give, so it has been given.

## 2012-06-09 MED ORDER — OXYCODONE-ACETAMINOPHEN 5-325 MG PO TABS: 1.0000 | ORAL_TABLET | Freq: Four times a day (QID) | ORAL | Status: AC | PRN

## 2012-06-09 MED ORDER — ENOXAPARIN SODIUM 40 MG/0.4ML ~~LOC~~ SOLN: 40.0000 mg | SUBCUTANEOUS | Status: DC

## 2012-06-09 NOTE — Discharge Summary (Signed)
Physician Discharge Summary  Patient ID: [name] [name] MRN: [id number] DOB/AGE: 63-May-1952 63 y.o.  Admit date: 06/06/2012 Discharge date: 06/09/2012  Admission Diagnoses: Malignant ascites  Discharge Diagnoses:  Principal Problem:  *Malignant ascites   Discharged Condition: good  Hospital Course:  On 06/06/2012, the patient underwent the following: Procedure(s): EXPLORATORY LAPAROTOMY OMENTECTOMY APPENDECTOMY SALPINGO OOPHERECTOMY.   The postoperative course was uneventful.  She was discharged to home on postoperative day 3 tolerating a regular diet.  Consults: None  Significant Diagnostic Studies: none  Treatments: surgery  Discharge Exam: Blood pressure 106/68, pulse 76, temperature 97.2 F (36.2 C), temperature source Oral, resp. rate 18, height 5\' 5"  (1.651 m), weight 83.19 kg (183 lb 6.4 oz), SpO2 90.00%. General appearance: alert Resp: diminished breath sounds bibasilar GI: soft, non-tender; bowel sounds normal; no masses,  no organomegaly Extremities: edema pitting Incision/Wound:  C/D/I  Disposition: Home  Discharge Orders    Future Orders Please Complete By Expires   Diet - low sodium heart healthy      Increase activity slowly      May walk up steps      May shower / Bathe      Comments:   No tub baths for 6 weeks   Driving Restrictions      Comments:   No driving for 1- 2 weeks   Lifting restrictions      Comments:   No lifting > 30 lbs for 6 weeks   Sexual Activity Restrictions      Comments:   No intercourse for 6 - 8 weeks   Discharge wound care:      Comments:   Keep clean and dry   Call MD for:  temperature >100.4      Call MD for:  persistant nausea and vomiting      Call MD for:  severe uncontrolled pain      Call MD for:  redness, tenderness, or signs of infection (pain, swelling, redness, odor or green/yellow discharge around incision site)      Call MD for:  persistant dizziness or light-headedness      Call MD for:  extreme fatigue          Medication List  As of 06/09/2012 11:15 AM   TAKE these medications         aspirin 81 MG chewable tablet   Chew 81 mg by mouth every other day.      cetirizine 10 MG tablet   Commonly known as: ZYRTEC   Take 10 mg by mouth as needed.      enoxaparin 40 MG/0.4ML injection   Commonly known as: LOVENOX   Inject 0.4 mLs (40 mg total) into the skin daily.      losartan 100 MG tablet   Commonly known as: COZAAR   Take 100 mg by mouth daily with breakfast.      metoprolol 50 MG tablet   Commonly known as: LOPRESSOR   Take 50 mg by mouth 2 (two) times daily.      oxyCODONE-acetaminophen 5-325 MG per tablet   Commonly known as: PERCOCET   Take 1-2 tablets by mouth every 6 (six) hours as needed (moderate to severe pain (when tolerating fluids)).      simvastatin 40 MG tablet   Commonly known as: ZOCOR   Take 40 mg by mouth every evening.           Follow-up Information    Please follow up. (Call Deatra Robinson RN next week [phone])  SignedAntionette Char A 06/09/2012, 11:15 AM

## 2012-06-09 NOTE — Progress Notes (Signed)
Pt discharged home via family; Pt and family given and explained all discharge instructions, carenotes, and prescriptions; pt and family stated understanding and denied questions/concerns; all f/u appointments in place; IV removed without complicaitons; pt stable at time of discharge  

## 2012-06-10 LAB — TYPE AND SCREEN
ABO/RH(D): A NEG
Antibody Screen: NEGATIVE
Unit division: 0

## 2012-06-12 ENCOUNTER — Telehealth: Payer: Self-pay | Admitting: Gynecologic Oncology

## 2012-06-12 NOTE — Telephone Encounter (Signed)
Post op telephone call to check patient status.  Patient describes expected post operative status.  Adequate PO intake reported.  Bowels and bladder functioning without difficulty.  Pain minimal.  Reportable signs and symptoms reviewed.  Follow up on Wednesday July 10th for staple removal.

## 2012-06-14 ENCOUNTER — Encounter: Payer: Self-pay | Admitting: Gynecologic Oncology

## 2012-06-14 NOTE — Progress Notes (Signed)
Pt arrived to the office today for staple removal.  22 staples removed from midline incision with no difficulty.  Steri strips applied.  No redness or drainage noted.  Pt continuing to report adequate PO intake and bowel/bladder habits.  No questions or concerns voiced.  Pt to return to the office on July 21, 2012 for follow up with Dr. Stanford Breed.

## 2012-07-21 ENCOUNTER — Ambulatory Visit: Payer: Medicare Other | Attending: Gynecology | Admitting: Gynecology

## 2012-07-21 ENCOUNTER — Encounter: Payer: Self-pay | Admitting: Gynecology

## 2012-07-21 VITALS — BP 110/68 | HR 72 | Temp 98.2°F | Resp 18 | Ht 65.35 in | Wt 153.7 lb

## 2012-07-21 DIAGNOSIS — Z7982 Long term (current) use of aspirin: Secondary | ICD-10-CM | POA: Insufficient documentation

## 2012-07-21 DIAGNOSIS — R180 Malignant ascites: Secondary | ICD-10-CM | POA: Insufficient documentation

## 2012-07-21 DIAGNOSIS — C5700 Malignant neoplasm of unspecified fallopian tube: Secondary | ICD-10-CM | POA: Insufficient documentation

## 2012-07-21 DIAGNOSIS — K219 Gastro-esophageal reflux disease without esophagitis: Secondary | ICD-10-CM | POA: Insufficient documentation

## 2012-07-21 DIAGNOSIS — E7800 Pure hypercholesterolemia, unspecified: Secondary | ICD-10-CM | POA: Insufficient documentation

## 2012-07-21 DIAGNOSIS — Z79899 Other long term (current) drug therapy: Secondary | ICD-10-CM | POA: Insufficient documentation

## 2012-07-21 DIAGNOSIS — Z9089 Acquired absence of other organs: Secondary | ICD-10-CM | POA: Insufficient documentation

## 2012-07-21 DIAGNOSIS — I10 Essential (primary) hypertension: Secondary | ICD-10-CM | POA: Insufficient documentation

## 2012-07-21 DIAGNOSIS — Z853 Personal history of malignant neoplasm of breast: Secondary | ICD-10-CM | POA: Insufficient documentation

## 2012-07-21 DIAGNOSIS — Z1501 Genetic susceptibility to malignant neoplasm of breast: Secondary | ICD-10-CM | POA: Insufficient documentation

## 2012-07-21 DIAGNOSIS — J910 Malignant pleural effusion: Secondary | ICD-10-CM | POA: Insufficient documentation

## 2012-07-21 DIAGNOSIS — Z9079 Acquired absence of other genital organ(s): Secondary | ICD-10-CM | POA: Insufficient documentation

## 2012-07-21 DIAGNOSIS — C786 Secondary malignant neoplasm of retroperitoneum and peritoneum: Secondary | ICD-10-CM | POA: Insufficient documentation

## 2012-07-21 NOTE — Patient Instructions (Signed)
You may return to full levels of activity. Please plan on seeing me in approximately 8 weeks after her fourth cycle of chemotherapy.

## 2012-07-21 NOTE — Progress Notes (Signed)
Consult Note: Gyn-Onc   [name] [name] 61 y.o. female  Chief Complaint  Patient presents with  . Malignant ascites    Follow up    Interval History: The patient returns today for six-week checkup. Overall she's had an uncomplicated postoperative course. She is now had one cycle of carboplatin and Taxol chemotherapy and seemed to have tolerated it reasonably well. Her functional status is good. She denies any GI or GU symptoms. She does note that she had a thoracentesis just prior to the initiation of chemotherapy.  HPI:HPI: 61 year old white female seen in consultation request of Dr. Gery Pray of [place] regarding management of new onset of ascites, malignant pleural effusion, and an omental cake. Patient reports that shortly after [place] Day she developed rapid onset of abdominal distention. A CT scan has been obtained showing pleural effusion, significant abdominal ascites, an omental cake and carcinomatosis. A liter of pleural effusion is been removed as well as a liter and a half from the abdomen. Cytology showed malignant cells consistent with adenocarcinoma. Patient CA 125 value is 656 units per mL CA 27.29 is 208 units per mL CEA is less than 0.3.   On 06/06/2012 the patient underwent exploratory laparotomy she was found to have diffuse metastatic high-grade serous carcinoma arising from the fallopian tube. At the time of surgery she had 6.5 L of ascites, the omentum was completely replaced by metastatic disease and diaphragms had metastatic disease on both surfaces. There was also considerable amount of disease in the pelvic peritoneum and bladder flap. There is also considerable amount of metastatic disease around the appendix. Appendectomy, total omentectomy, and left salpingo-oophorectomy were performed. The patient was suboptimally debulked..   Review of Systems:10 point review of systems is negative as noted above.   Vitals: Blood pressure 110/68, pulse 72, temperature 98.2 F  (36.8 C), resp. rate 18, height 5' 5.35" (1.66 m), weight 153 lb 11.2 oz (69.718 kg).  Physical Exam: General : The patient is a healthy woman in no acute distress.  HEENT: normocephalic, extraoccular movements normal; neck is supple without thyromegally  Lynphnodes: Supraclavicular and inguinal nodes not enlarged  Abdomen: Soft, non-tender, no ascites, no organomegally, no masses, no hernias incision is well-healed Pelvic:  EGBUS: Normal female  Vagina: Normal, no lesions  Urethra and Bladder: Normal, non-tender  Cervix normal t  Uterus: There is nodularity in the posterior cul-de-sac and adnexa precluding good exam of the uterus.    Rectal: normal sphincter tone, nodularity is noted in the posterior cul-de-sac, no blood  Lower extremities: No edema or varicosities. Normal range of motion    Assessment/Plan: Stage IV fallopian tube cancer suboptimally debulked on this 06/06/2012. The patient is given the okay to return for levels of activity. She will continue chemotherapy under the direction of Dr. Gilman Buttner. I last to return to see me for another checkup in approximately 8 weeks. We will continue to monitor her response to chemotherapy with CA 125 and CA 27.29 at each treatment.  No Known Allergies  Past Medical History  Diagnosis Date  . Breast cancer   . Hypertension   . Hypercholesteremia   . Ascites   . Shortness of breath     some due to ascites   . GERD (gastroesophageal reflux disease)   . H/O hiatal hernia   . Anemia   . Lymph edema     left arm   . Collapsed vertebra     thoracic to lumbar vertebra   . Arthritis  arthritis in back     Past Surgical History  Procedure Date  . Mastectomy, partial Jan 2005    Left  . Uterine fibroid surgery 2003  . Eye surgery     as a child   . Laparotomy 06/06/2012    Procedure: EXPLORATORY LAPAROTOMY;  Surgeon: Jeannette Corpus, MD;  Location: [facility];  Service: Gynecology;  Laterality: N/A;  . Appendectomy 06/06/2012     Procedure: APPENDECTOMY;  Surgeon: Jeannette Corpus, MD;  Location: [facility];  Service: Gynecology;;  . Salpingoophorectomy 06/06/2012    Procedure: SALPINGO OOPHERECTOMY;  Surgeon: Jeannette Corpus, MD;  Location: [facility];  Service: Gynecology;  Laterality: Left;    Current Outpatient Prescriptions  Medication Sig Dispense Refill  . Cyanocobalamin (B-12 PO) Take 1 tablet by mouth daily.      . Ferrous Fum-Iron Polysacch (TANDEM PO) Take 1 tablet by mouth daily.      [name] aspirin 81 MG chewable tablet Chew 81 mg by mouth every other day.      . cetirizine (ZYRTEC) 10 MG tablet Take 10 mg by mouth as needed.      . enoxaparin (LOVENOX) 40 MG/0.4ML injection Inject 0.4 mLs (40 mg total) into the skin daily.  25 Syringe  0  . losartan (COZAAR) 100 MG tablet Take 100 mg by mouth daily with breakfast.      . metoprolol (LOPRESSOR) 50 MG tablet Take 50 mg by mouth 2 (two) times daily.      . simvastatin (ZOCOR) 40 MG tablet Take 40 mg by mouth every evening.        History   Social History  . Marital Status: Married    Spouse Name: N/A    Number of Children: N/A  . Years of Education: N/A   Occupational History  . Not on file.   Social History Main Topics  . Smoking status: Former Smoker    Quit date: 12/06/2002  . Smokeless tobacco: Never Used  . Alcohol Use: No  . Drug Use: No  . Sexually Active: Not on file   Other Topics Concern  . Not on file   Social History Narrative  . No narrative on file    Family History  Problem Relation Age of Onset  . Colon cancer Mother   . Uterine cancer Sister   . Uterine cancer Daughter       Jeannette Corpus, MD 07/21/2012, 9:48 AM

## 2012-09-20 ENCOUNTER — Ambulatory Visit: Payer: Medicare Other | Attending: Gynecology | Admitting: Gynecology

## 2012-09-20 ENCOUNTER — Encounter: Payer: Self-pay | Admitting: Gynecology

## 2012-09-20 VITALS — BP 118/68 | HR 80 | Temp 98.7°F | Resp 20 | Ht 65.35 in | Wt 159.3 lb

## 2012-09-20 DIAGNOSIS — C786 Secondary malignant neoplasm of retroperitoneum and peritoneum: Secondary | ICD-10-CM | POA: Insufficient documentation

## 2012-09-20 DIAGNOSIS — Z853 Personal history of malignant neoplasm of breast: Secondary | ICD-10-CM | POA: Insufficient documentation

## 2012-09-20 DIAGNOSIS — I10 Essential (primary) hypertension: Secondary | ICD-10-CM | POA: Insufficient documentation

## 2012-09-20 DIAGNOSIS — C5700 Malignant neoplasm of unspecified fallopian tube: Secondary | ICD-10-CM | POA: Insufficient documentation

## 2012-09-20 DIAGNOSIS — Z9089 Acquired absence of other organs: Secondary | ICD-10-CM | POA: Insufficient documentation

## 2012-09-20 DIAGNOSIS — Z7982 Long term (current) use of aspirin: Secondary | ICD-10-CM | POA: Insufficient documentation

## 2012-09-20 DIAGNOSIS — Z9079 Acquired absence of other genital organ(s): Secondary | ICD-10-CM | POA: Insufficient documentation

## 2012-09-20 DIAGNOSIS — Z79899 Other long term (current) drug therapy: Secondary | ICD-10-CM | POA: Insufficient documentation

## 2012-09-20 DIAGNOSIS — E7800 Pure hypercholesterolemia, unspecified: Secondary | ICD-10-CM | POA: Insufficient documentation

## 2012-09-20 DIAGNOSIS — K219 Gastro-esophageal reflux disease without esophagitis: Secondary | ICD-10-CM | POA: Insufficient documentation

## 2012-09-20 NOTE — Progress Notes (Signed)
Consult Note: Gyn-Onc   [name] [name] 61 y.o. female  Chief Complaint  Patient presents with  . Fallopian tube cancer    Follow up    Interval History:  The patient returns as previously scheduled along with her husband. She is now received 4 cycles of carboplatin and Taxol. She tells me that her most recent CA 125 value was 26 units per mL. Overall she's doing well. She specifically denies any GI or GU symptoms has no pelvic pain pressure vaginal bleeding or discharge. She does have some bone pain after receiving Neulasta. Her functional status is excellent.  HPI:61 year old white female seen in consultation request of Dr. Gery Pray of [place] regarding management of new onset of ascites, malignant pleural effusion, and an omental cake. Patient reports that shortly after [place] Day she developed rapid onset of abdominal distention. A CT scan has been obtained showing pleural effusion, significant abdominal ascites, an omental cake and carcinomatosis. A liter of pleural effusion was removed as well as a liter and a half from the abdomen. Cytology showed malignant cells consistent with adenocarcinoma. CA 125 value was 656 units per mL CA 27.29 is 208 units per mL CEA is less than 0.3.  On 06/06/2012 the patient underwent exploratory laparotomy she was found to have diffuse metastatic high-grade serous carcinoma arising from the fallopian tube. At the time of surgery she had 6.5 L of ascites, the omentum was completely replaced by metastatic disease and diaphragms had metastatic disease on both surfaces. There was also considerable amount of disease in the pelvic peritoneum and bladder flap. There is also considerable amount of metastatic disease around the appendix. Appendectomy, total omentectomy, and left salpingo-oophorectomy were performed. The patient was suboptimally debulked..   Review of Systems:10 point review of systems is negative as noted above.   Vitals: Blood pressure 118/68,  pulse 80, temperature 98.7 F (37.1 C), temperature source Oral, resp. rate 20, height 5' 5.35" (1.66 m), weight 159 lb 4.8 oz (72.258 kg).  Physical Exam: General : The patient is a healthy woman in no acute distress.  HEENT: normocephalic, extraoccular movements normal; neck is supple without thyromegally  Lynphnodes: Supraclavicular and inguinal nodes not enlarged  Abdomen: Soft, non-tender, no ascites, no organomegally, no masses, no hernias  Pelvic:  EGBUS: Normal female  Vagina: Normal, no lesions  Urethra and Bladder: Normal, non-tender  Cervix: Normal  Uterus difficult to outline but there is no masses. Bi-manual examination: Non-tender; no adenxal masses or nodularity  Rectal: normal sphincter tone, no masses, no blood  Lower extremities: No edema or varicosities. Normal range of motion    Assessment/Plan: Suboptimal stage IV fallopian tube carcinoma. The patient is doing very well and is having an excellent response to chemotherapy. She will continue her chemotherapy to complete 6 cycles. Thereafter she will obtain a CT scan and I will plan on seeing the patient shortly after the CT scan is completed. All the patient's questions are answered.  No Known Allergies  Past Medical History  Diagnosis Date  . Breast cancer   . Hypertension   . Hypercholesteremia   . Ascites   . Shortness of breath     some due to ascites   . GERD (gastroesophageal reflux disease)   . H/O hiatal hernia   . Anemia   . Lymph edema     left arm   . Collapsed vertebra     thoracic to lumbar vertebra   . Arthritis     arthritis in back  Past Surgical History  Procedure Date  . Mastectomy, partial Jan 2005    Left  . Uterine fibroid surgery 2003  . Eye surgery     as a child   . Laparotomy 06/06/2012    Procedure: EXPLORATORY LAPAROTOMY;  Surgeon: Jeannette Corpus, MD;  Location: [facility];  Service: Gynecology;  Laterality: N/A;  . Appendectomy 06/06/2012    Procedure:  APPENDECTOMY;  Surgeon: Jeannette Corpus, MD;  Location: [facility];  Service: Gynecology;;  . Salpingoophorectomy 06/06/2012    Procedure: SALPINGO OOPHERECTOMY;  Surgeon: Jeannette Corpus, MD;  Location: [facility];  Service: Gynecology;  Laterality: Left;    Current Outpatient Prescriptions  Medication Sig Dispense Refill  . cetirizine (ZYRTEC) 10 MG tablet Take 10 mg by mouth as needed.      . Cyanocobalamin (B-12 PO) Take 1 tablet by mouth daily.      . ferrous sulfate (IRON SUPPLEMENT) 325 (65 FE) MG tablet Take 325 mg by mouth daily with breakfast. [employer]      . losartan (COZAAR) 100 MG tablet Take 100 mg by mouth daily with breakfast.      . metoprolol (LOPRESSOR) 50 MG tablet Take 50 mg by mouth 2 (two) times daily.      . simvastatin (ZOCOR) 40 MG tablet Take 40 mg by mouth every evening.      [name] aspirin 81 MG chewable tablet Chew 81 mg by mouth every other day.      . enoxaparin (LOVENOX) 40 MG/0.4ML injection Inject 0.4 mLs (40 mg total) into the skin daily.  25 Syringe  0  . Ferrous Fum-Iron Polysacch (TANDEM PO) Take 1 tablet by mouth daily.        History   Social History  . Marital Status: Married    Spouse Name: N/A    Number of Children: N/A  . Years of Education: N/A   Occupational History  . Not on file.   Social History Main Topics  . Smoking status: Former Smoker    Quit date: 12/06/2002  . Smokeless tobacco: Never Used  . Alcohol Use: No  . Drug Use: No  . Sexually Active: Not on file   Other Topics Concern  . Not on file   Social History Narrative  . No narrative on file    Family History  Problem Relation Age of Onset  . Colon cancer Mother   . Uterine cancer Sister   . Uterine cancer Daughter       Jeannette Corpus, MD 09/20/2012, 9:50 AM

## 2012-09-20 NOTE — Patient Instructions (Signed)
Continue to receive chemotherapy to complete 6 cycles. Approximately a month after your last cycle of chemotherapy we will arrange to have a CT scan of the chest abdomen and pelvis and [place]. I will plan on seeing her back in our office shortly after the CT scan is completed.

## 2012-12-29 ENCOUNTER — Encounter: Payer: Self-pay | Admitting: Gynecology

## 2012-12-29 ENCOUNTER — Ambulatory Visit: Payer: Medicare Other | Attending: Gynecology | Admitting: Gynecology

## 2012-12-29 VITALS — BP 132/70 | HR 78 | Temp 99.2°F | Resp 18 | Ht 65.35 in | Wt 170.0 lb

## 2012-12-29 DIAGNOSIS — E7800 Pure hypercholesterolemia, unspecified: Secondary | ICD-10-CM | POA: Insufficient documentation

## 2012-12-29 DIAGNOSIS — Z853 Personal history of malignant neoplasm of breast: Secondary | ICD-10-CM | POA: Insufficient documentation

## 2012-12-29 DIAGNOSIS — Z800 Family history of malignant neoplasm of digestive organs: Secondary | ICD-10-CM | POA: Insufficient documentation

## 2012-12-29 DIAGNOSIS — Z7982 Long term (current) use of aspirin: Secondary | ICD-10-CM | POA: Insufficient documentation

## 2012-12-29 DIAGNOSIS — Z79899 Other long term (current) drug therapy: Secondary | ICD-10-CM | POA: Insufficient documentation

## 2012-12-29 DIAGNOSIS — Z9079 Acquired absence of other genital organ(s): Secondary | ICD-10-CM | POA: Insufficient documentation

## 2012-12-29 DIAGNOSIS — C7982 Secondary malignant neoplasm of genital organs: Secondary | ICD-10-CM | POA: Insufficient documentation

## 2012-12-29 DIAGNOSIS — Z8049 Family history of malignant neoplasm of other genital organs: Secondary | ICD-10-CM | POA: Insufficient documentation

## 2012-12-29 DIAGNOSIS — C569 Malignant neoplasm of unspecified ovary: Secondary | ICD-10-CM | POA: Insufficient documentation

## 2012-12-29 DIAGNOSIS — J90 Pleural effusion, not elsewhere classified: Secondary | ICD-10-CM | POA: Insufficient documentation

## 2012-12-29 DIAGNOSIS — K219 Gastro-esophageal reflux disease without esophagitis: Secondary | ICD-10-CM | POA: Insufficient documentation

## 2012-12-29 DIAGNOSIS — Z09 Encounter for follow-up examination after completed treatment for conditions other than malignant neoplasm: Secondary | ICD-10-CM | POA: Insufficient documentation

## 2012-12-29 DIAGNOSIS — G609 Hereditary and idiopathic neuropathy, unspecified: Secondary | ICD-10-CM | POA: Insufficient documentation

## 2012-12-29 DIAGNOSIS — I10 Essential (primary) hypertension: Secondary | ICD-10-CM | POA: Insufficient documentation

## 2012-12-29 DIAGNOSIS — C5700 Malignant neoplasm of unspecified fallopian tube: Secondary | ICD-10-CM

## 2012-12-29 NOTE — Patient Instructions (Addendum)
Please return to see Dr. Gilman Buttner in 3 months return to see [place] in 6 months. We should obtain a CA 125 every 3 months. Dr. Gilman Buttner will arrange for a repeat CT scan in approximately 3 months.

## 2012-12-29 NOTE — Progress Notes (Signed)
Consult Note: Gyn-Onc   [name] [name] 62 y.o. female  Chief Complaint  Patient presents with  . Fallopian tube cancer    Follow up    Interval History: The patient returns today now having completed 6 cycles of carboplatin and Taxol for treatment of a suboptimally debulked stage IV (pleural effusion) ovarian cancer. Following initial debulking surgery and initiation of chemotherapy, her CA 125 value drop into the normal range quite promptly. Most recent value on 10/30/2012 was 10.6 units per mL. Following completion of chemotherapy she underwent a CT scan which shows no evidence of disease in the abdomen or pelvis although there is a right pleural effusion which is very small and unchanged from 05/25/2012.  Patient itself feels quite well. She has good energy and functional status is normal. She does have some mild residual neuropathy in her feet and hands.  HPI:62 year old white female initially seen in consultation request of Dr. Gery Pray of [place] regarding management of new onset of ascites, malignant pleural effusion, and an omental cake. Patient reported that shortly after Memorial Day 2013 she developed rapid onset of abdominal distention. A CT scan was been obtained showing pleural effusion, significant abdominal ascites, an omental cake and carcinomatosis. A liter of pleural effusion was removed as well as a liter and a half from the abdomen. Cytology showed malignant cells consistent with adenocarcinoma. CA 125 value was 656 units per mL CA 27.29 was 208 units per mL CEA was less than 0.3.  On 06/06/2012 the patient underwent exploratory laparotomy. She was found to have diffuse metastatic high-grade serous carcinoma arising from the fallopian tube. At the time of surgery she had 6.5 L of ascites, the omentum was completely replaced by metastatic disease and diaphragms had metastatic disease on both surfaces. There was also considerable amount of disease in the pelvic peritoneum and  bladder flap, precluding abdominal hysterectomy.. There is also considerable amount of metastatic disease around the appendix. Appendectomy, total omentectomy, and left salpingo-oophorectomy were performed. The patient was suboptimally debulked.[name]  Postoperatively the patient received 6 cycles of carboplatin and Taxol chemotherapy. At the completion of chemotherapy her CA 125 level was 10.6 units per mL and a followup CT scan showed no evidence of disease in the abdomen or pelvis and only residual small right pleural effusion.   Review of Systems:10 point review of systems is negative as noted above.   Vitals: Blood pressure 132/70, pulse 78, temperature 99.2 F (37.3 C), temperature source Oral, resp. rate 18, height 5' 5.35" (1.66 m), weight 170 lb (77.111 kg).  Physical Exam: General : The patient is a healthy woman in no acute distress.  HEENT: normocephalic, extraoccular movements normal; neck is supple without thyromegally  Lynphnodes: Supraclavicular and inguinal nodes not enlarged  Abdomen: Soft, non-tender, no ascites, no organomegally, no masses, no hernias  Pelvic:  EGBUS: Normal female  Vagina: Normal, no lesions  Urethra and Bladder: Normal, non-tender  Cervix: Normal Uterus: Normal  Bi-manual examination: Non-tender; no adenxal masses or nodularity  Rectal: normal sphincter tone, no masses, no blood  Lower extremities: No edema or varicosities. 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 Overall the patient sees be clinically free of disease although we must monitor the right pleural effusion.  She has mild peripheral neuropathy and I would recommend she began taking vitamin B6 100 mg twice a day.  She will return to see  Dr. Gilman Buttner in 3 months. At that time it appears as it is planned to have a repeat CT scan and we should also obtain a CA  125.  I will plan on seeing the patient back again in 6 months or as needed.  No Known Allergies  Past Medical History  Diagnosis Date  . Breast cancer   . Hypertension   . Hypercholesteremia   . Ascites   . Shortness of breath     some due to ascites   . GERD (gastroesophageal reflux disease)   . H/O hiatal hernia   . Anemia   . Lymph edema     left arm   . Collapsed vertebra     thoracic to lumbar vertebra   . Arthritis     arthritis in back     Past Surgical History  Procedure Date  . Mastectomy, partial Jan 2005    Left  . Uterine fibroid surgery 2003  . Eye surgery     as a child   . Laparotomy 06/06/2012    Procedure: EXPLORATORY LAPAROTOMY;  Surgeon: Jeannette Corpus, MD;  Location: [facility];  Service: Gynecology;  Laterality: N/A;  . Appendectomy 06/06/2012    Procedure: APPENDECTOMY;  Surgeon: Jeannette Corpus, MD;  Location: [facility];  Service: Gynecology;;  . Salpingoophorectomy 06/06/2012    Procedure: SALPINGO OOPHERECTOMY;  Surgeon: Jeannette Corpus, MD;  Location: [facility];  Service: Gynecology;  Laterality: Left;    Current Outpatient Prescriptions  Medication Sig Dispense Refill  . aspirin 81 MG chewable tablet Chew 81 mg by mouth every other day.      . cetirizine (ZYRTEC) 10 MG tablet Take 10 mg by mouth as needed.      . Cyanocobalamin (B-12 PO) Take 1 tablet by mouth daily.      [name] enoxaparin (LOVENOX) 40 MG/0.4ML injection Inject 0.4 mLs (40 mg total) into the skin daily.  25 Syringe  0  . Ferrous Fum-Iron Polysacch (TANDEM PO) Take 1 tablet by mouth daily.      . ferrous sulfate (IRON SUPPLEMENT) 325 (65 FE) MG tablet Take 325 mg by mouth daily with breakfast. [facility]      . losartan (COZAAR) 100 MG tablet Take 100 mg by mouth daily with breakfast.      . metoprolol (LOPRESSOR) 50 MG tablet Take 50 mg by mouth 2 (two) times daily.      . simvastatin (ZOCOR) 40 MG tablet Take 40 mg by mouth every evening.        History   Social  History  . Marital Status: Married    Spouse Name: N/A    Number of Children: N/A  . Years of Education: N/A   Occupational History  . Not on file.   Social History Main Topics  . Smoking status: Former Smoker    Quit date: 12/06/2002  . Smokeless tobacco: Never Used  . Alcohol Use: No  . Drug Use: No  . Sexually Active: Not on file   Other Topics Concern  . Not on file   Social History Narrative  . No narrative on file    Family History  Problem Relation Age of Onset  . Colon cancer Mother   . Uterine cancer Sister   . Uterine cancer Daughter       Jeannette Corpus, MD 12/29/2012, 9:55 AM

## 2013-06-29 ENCOUNTER — Encounter: Payer: Self-pay | Admitting: Gynecology

## 2013-06-29 ENCOUNTER — Ambulatory Visit: Payer: Medicare Other | Attending: Gynecology | Admitting: Gynecology

## 2013-06-29 VITALS — BP 122/64 | HR 68 | Temp 98.9°F | Resp 16 | Ht 65.0 in | Wt 178.1 lb

## 2013-06-29 DIAGNOSIS — Z79899 Other long term (current) drug therapy: Secondary | ICD-10-CM | POA: Insufficient documentation

## 2013-06-29 DIAGNOSIS — E7800 Pure hypercholesterolemia, unspecified: Secondary | ICD-10-CM | POA: Insufficient documentation

## 2013-06-29 DIAGNOSIS — Z853 Personal history of malignant neoplasm of breast: Secondary | ICD-10-CM | POA: Insufficient documentation

## 2013-06-29 DIAGNOSIS — Z9221 Personal history of antineoplastic chemotherapy: Secondary | ICD-10-CM | POA: Insufficient documentation

## 2013-06-29 DIAGNOSIS — I10 Essential (primary) hypertension: Secondary | ICD-10-CM | POA: Insufficient documentation

## 2013-06-29 DIAGNOSIS — Z8049 Family history of malignant neoplasm of other genital organs: Secondary | ICD-10-CM | POA: Insufficient documentation

## 2013-06-29 DIAGNOSIS — K219 Gastro-esophageal reflux disease without esophagitis: Secondary | ICD-10-CM | POA: Insufficient documentation

## 2013-06-29 DIAGNOSIS — G609 Hereditary and idiopathic neuropathy, unspecified: Secondary | ICD-10-CM | POA: Insufficient documentation

## 2013-06-29 DIAGNOSIS — Z7982 Long term (current) use of aspirin: Secondary | ICD-10-CM | POA: Insufficient documentation

## 2013-06-29 DIAGNOSIS — J90 Pleural effusion, not elsewhere classified: Secondary | ICD-10-CM | POA: Insufficient documentation

## 2013-06-29 DIAGNOSIS — C5700 Malignant neoplasm of unspecified fallopian tube: Secondary | ICD-10-CM

## 2013-06-29 NOTE — Progress Notes (Signed)
Consult Note: Gyn-Onc   [name] [name] 62 y.o. 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 Clinically free of disease.  Plan the patient return to see Dr. Gilman Buttner in 3 months and return to see [place] in 6 months. She indicates she scheduled to have a CT scan in October. Would recommend obtain CA 125 values every 3 months as well. It is okay with me if the patient has her Port-A-Cath removed.   Interval History: The patient returns today for continuing followup of stage IIIc fallopian tube cancer. She saw Dr. Gilman Buttner 3 months ago at which time her CA 125 value she reports to me was 5.5 units per mL. She also reports a more recent CA 125 in the month of July was 4.5 units per mL Patient herself  feels quite well. She has good energy and functional status is normal. She does have some mild residual neuropathy in her feet and hands.  HPI:62 year old white female initially seen in consultation request of Dr. Gery Pray of [place] regarding management of new onset of ascites, malignant pleural effusion, and an omental cake. Patient reported that shortly after Memorial Day 2013 she developed rapid onset of abdominal distention. A CT scan was been obtained showing pleural effusion, significant abdominal ascites, an omental cake and carcinomatosis. A liter of pleural effusion was removed as well as a liter and a half from the abdomen. Cytology showed malignant cells consistent with adenocarcinoma. CA 125 value was 656 units per mL CA 27.29 was 208 units per mL CEA was less than 0.3.   On 06/06/2012 the patient underwent exploratory laparotomy. She was found to have diffuse metastatic high-grade serous carcinoma arising from the fallopian tube. At the time of surgery she had 6.5 L of ascites, the omentum was completely replaced by metastatic disease and diaphragms had metastatic disease on both surfaces. There was also considerable amount of disease in the pelvic  peritoneum and bladder flap, precluding abdominal hysterectomy.. There is also considerable amount of metastatic disease around the appendix. Appendectomy, total omentectomy, and left salpingo-oophorectomy were performed. The patient was suboptimally debulked.[name] [name]  Postoperatively the patient received 6 cycles of carboplatin and Taxol chemotherapy. At the completion of chemotherapy her CA 125 level was 10.6 units per mL and a followup CT scan showed no evidence of disease in the abdomen or pelvis and only residual small right pleural effusion (November 2013).   Review of Systems:10 point review of systems is negative as noted above.   Vitals: There were no vitals taken for this visit.  Physical Exam: General : The patient is a healthy woman in no acute distress.  HEENT: normocephalic, extraoccular movements normal; neck is supple without thyromegally  Lynphnodes: Supraclavicular and inguinal nodes not enlarged  Abdomen: Soft, non-tender, no ascites, no organomegally, no masses, no hernias  Pelvic:  EGBUS: Normal female  Vagina: Normal, no lesions  Urethra and Bladder: Normal, non-tender  Cervix: Normal Uterus: Normal  Bi-manual examination: Non-tender; no adenxal masses or nodularity  Rectal: normal sphincter tone, no masses, no blood  Lower extremities: No edema or varicosities. 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 Overall the patient sees be clinically free of disease although we must monitor the right pleural effusion.  She has mild peripheral neuropathy and I would recommend she began taking vitamin B6 100 mg twice a day.  She will return  to see Dr. Gilman Buttner in 3 months. At that time it appears as it is planned to have a repeat CT scan and we should also obtain a CA 125.  I will plan on seeing the patient back again in 6 months or as needed.  No  Known Allergies  Past Medical History  Diagnosis Date  . Breast cancer   . Hypertension   . Hypercholesteremia   . Ascites   . Shortness of breath     some due to ascites   . GERD (gastroesophageal reflux disease)   . H/O hiatal hernia   . Anemia   . Lymph edema     left arm   . Collapsed vertebra     thoracic to lumbar vertebra   . Arthritis     arthritis in back     Past Surgical History  Procedure Laterality Date  . Mastectomy, partial  Jan 2005    Left  . Uterine fibroid surgery  2003  . Eye surgery      as a child   . Laparotomy  06/06/2012    Procedure: EXPLORATORY LAPAROTOMY;  Surgeon: Jeannette Corpus, MD;  Location: [facility];  Service: Gynecology;  Laterality: N/A;  . Appendectomy  06/06/2012    Procedure: APPENDECTOMY;  Surgeon: Jeannette Corpus, MD;  Location: [facility];  Service: Gynecology;;  . Salpingoophorectomy  06/06/2012    Procedure: SALPINGO OOPHERECTOMY;  Surgeon: Jeannette Corpus, MD;  Location: [facility];  Service: Gynecology;  Laterality: Left;    Current Outpatient Prescriptions  Medication Sig Dispense Refill  . aspirin 81 MG chewable tablet Chew 81 mg by mouth every other day.      . cetirizine (ZYRTEC) 10 MG tablet Take 10 mg by mouth as needed.      . Cyanocobalamin (B-12 PO) Take 1 tablet by mouth daily.      [name] [name] enoxaparin (LOVENOX) 40 MG/0.4ML injection Inject 0.4 mLs (40 mg total) into the skin daily.  25 Syringe  0  . Ferrous Fum-Iron Polysacch (TANDEM PO) Take 1 tablet by mouth daily.      . ferrous sulfate (IRON SUPPLEMENT) 325 (65 FE) MG tablet Take 325 mg by mouth daily with breakfast. [facility]      . losartan (COZAAR) 100 MG tablet Take 100 mg by mouth daily with breakfast.      . metoprolol (LOPRESSOR) 50 MG tablet Take 50 mg by mouth 2 (two) times daily.      . simvastatin (ZOCOR) 40 MG tablet Take 40 mg by mouth every evening.       No current facility-administered medications for this visit.    History   Social  History  . Marital Status: Married    Spouse Name: N/A    Number of Children: N/A  . Years of Education: N/A   Occupational History  . Not on file.   Social History Main Topics  . Smoking status: Former Smoker    Quit date: 12/06/2002  . Smokeless tobacco: Never Used  . Alcohol Use: No  . Drug Use: No  . Sexually Active: Not on file   Other Topics Concern  . Not on file   Social History Narrative  . No narrative on file    Family History  Problem Relation Age of Onset  . Colon cancer Mother   . Uterine cancer Sister   . Uterine cancer Daughter       Jeannette Corpus, MD 06/29/2013, 11:35 AM

## 2013-06-29 NOTE — Patient Instructions (Signed)
Please see Dr. Gilman Buttner in 3 months return to see [place] in 6 months.

## 2013-07-09 IMAGING — CR DG CHEST 2V
2 series · 2 of 2 positions shown · non-contrast
Comparison: 05/11/2012

CLINICAL DATA: Preop.  Post thoracentesis.

CHEST - 2 VIEW

[w chest pa]
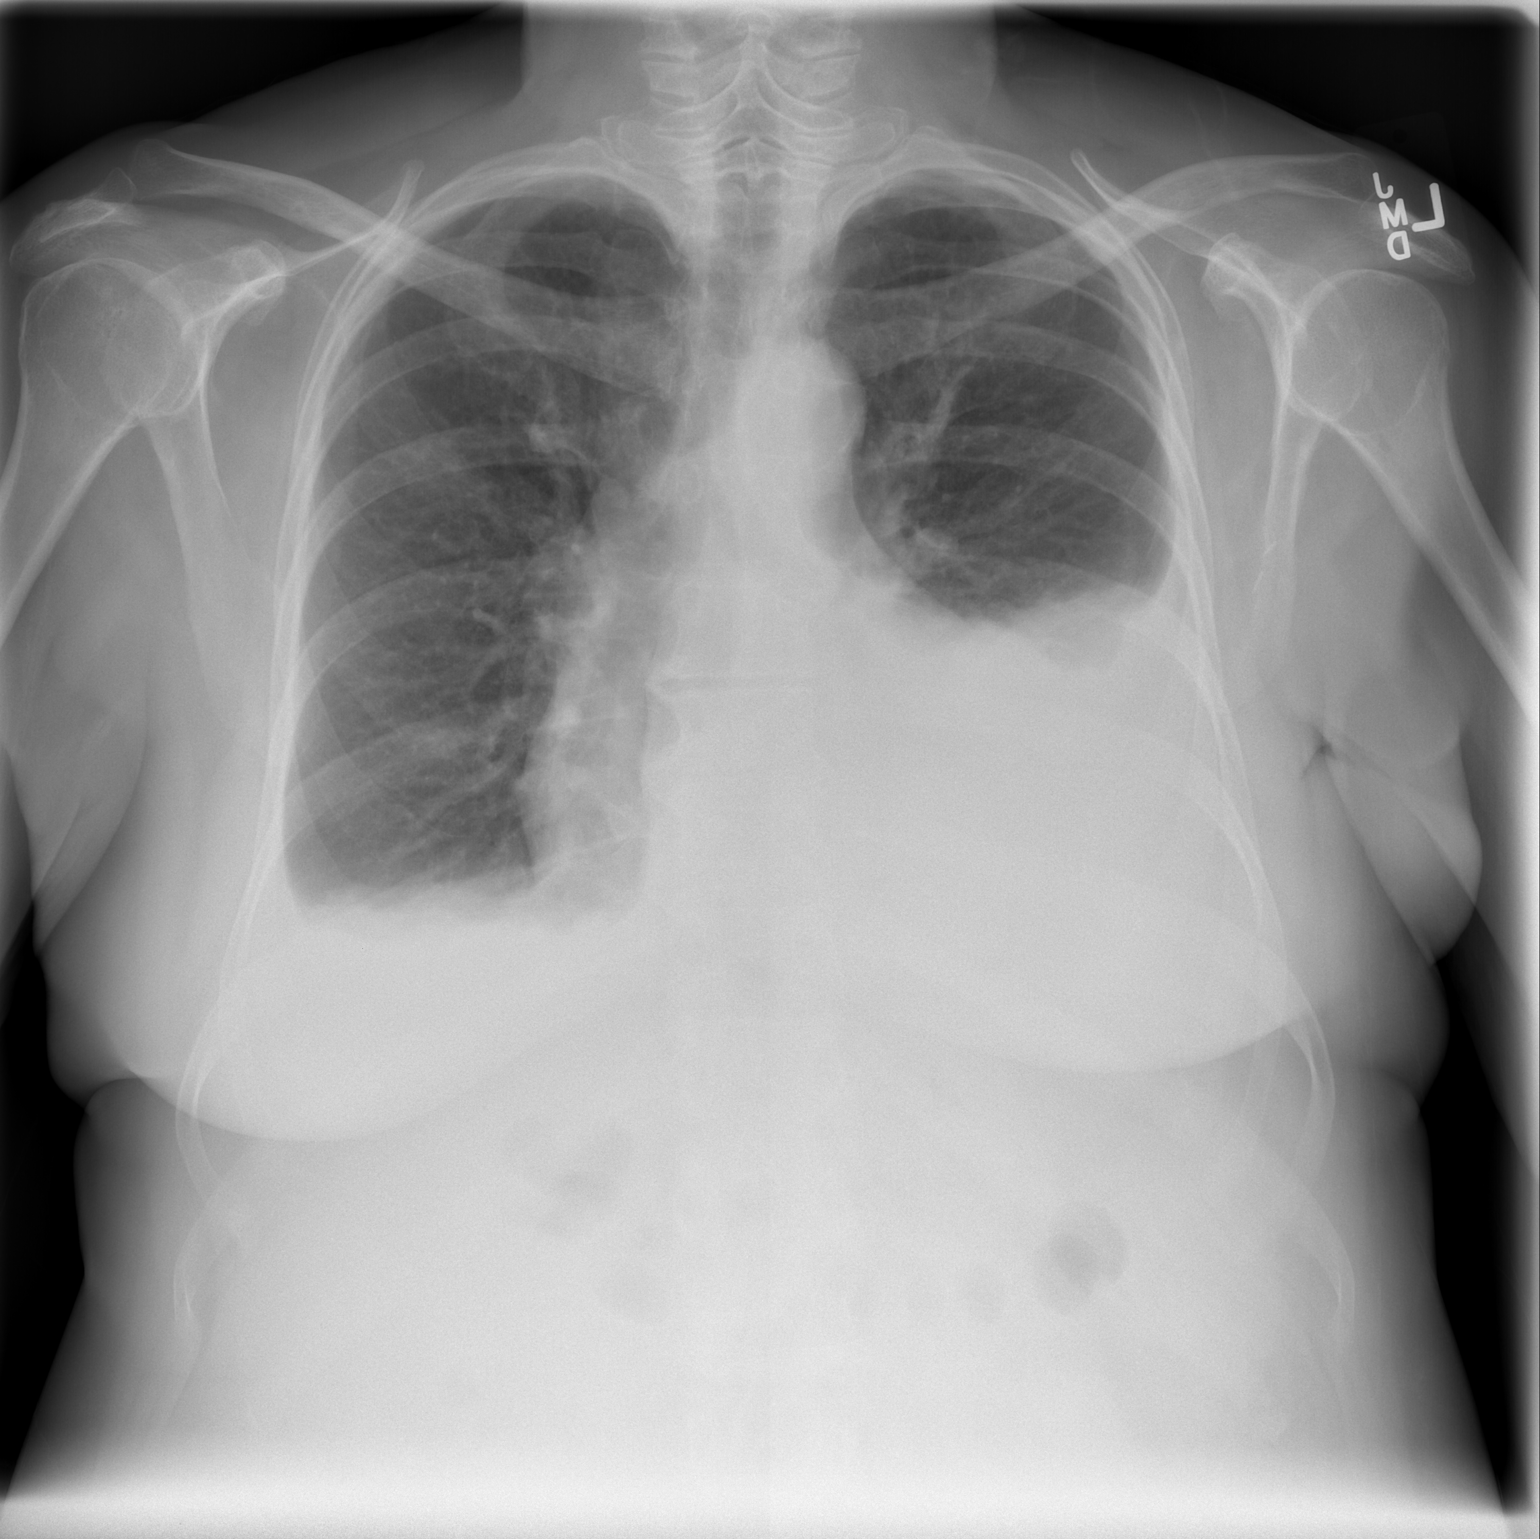

[w chest lat]
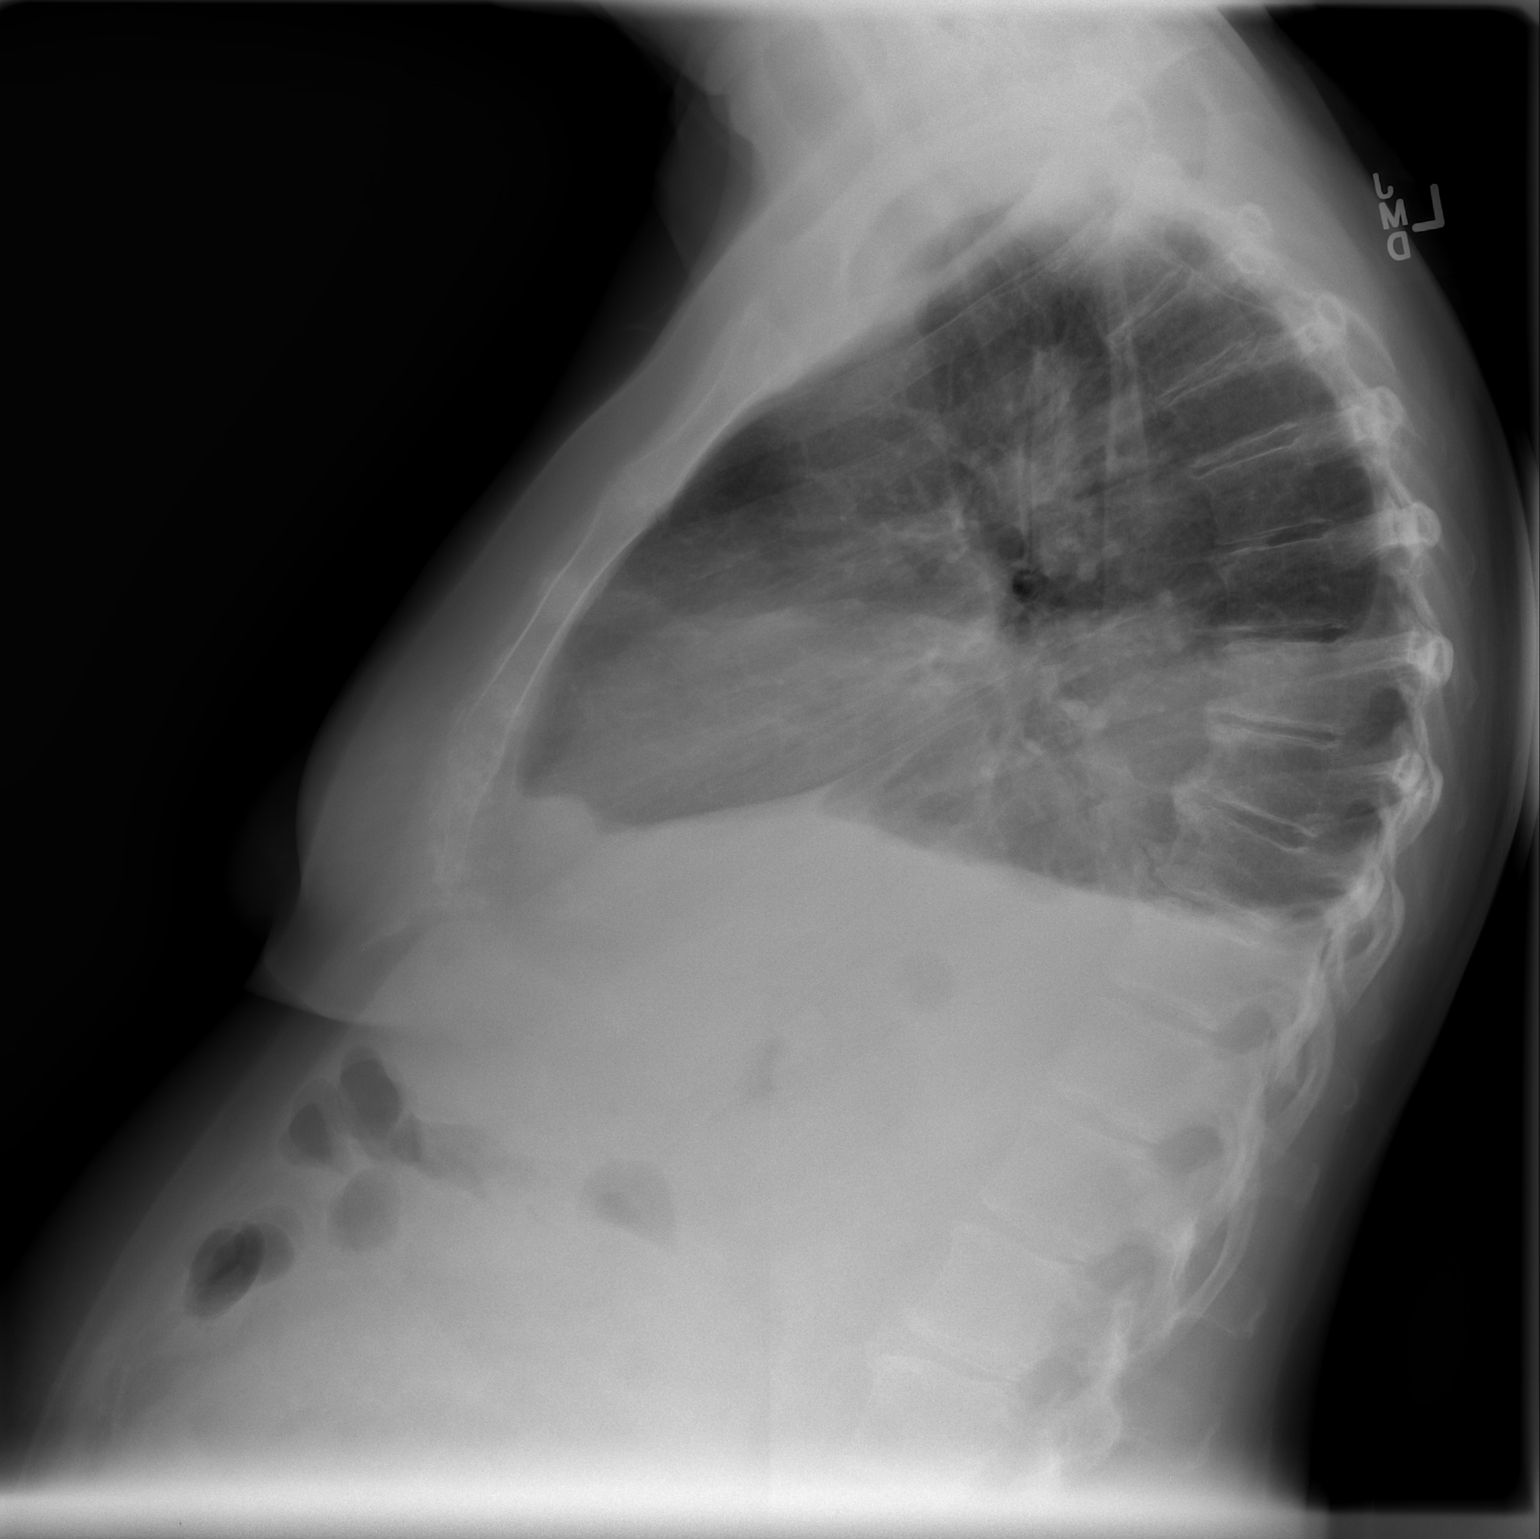

[2 of 2 positions shown; findings below may reference images not displayed]

FINDINGS: Large left pleural effusion and small right pleural
effusion noted.  No pneumothorax.  Mild cardiomegaly.  Bibasilar
atelectasis. No acute bony abnormality.
IMPRESSION: Large left effusion and small right effusion.  Bibasilar
atelectasis.

## 2013-12-21 ENCOUNTER — Encounter: Payer: Self-pay | Admitting: Gynecology

## 2013-12-21 ENCOUNTER — Ambulatory Visit: Payer: Medicare HMO | Attending: Gynecology | Admitting: Gynecology

## 2013-12-21 VITALS — BP 160/69 | HR 82 | Temp 98.0°F | Resp 16 | Ht 65.0 in | Wt 186.8 lb

## 2013-12-21 DIAGNOSIS — Z7982 Long term (current) use of aspirin: Secondary | ICD-10-CM | POA: Insufficient documentation

## 2013-12-21 DIAGNOSIS — C5700 Malignant neoplasm of unspecified fallopian tube: Secondary | ICD-10-CM | POA: Insufficient documentation

## 2013-12-21 DIAGNOSIS — Z79899 Other long term (current) drug therapy: Secondary | ICD-10-CM | POA: Insufficient documentation

## 2013-12-21 DIAGNOSIS — Z853 Personal history of malignant neoplasm of breast: Secondary | ICD-10-CM | POA: Insufficient documentation

## 2013-12-21 DIAGNOSIS — Z87891 Personal history of nicotine dependence: Secondary | ICD-10-CM | POA: Insufficient documentation

## 2013-12-21 DIAGNOSIS — I890 Lymphedema, not elsewhere classified: Secondary | ICD-10-CM | POA: Insufficient documentation

## 2013-12-21 DIAGNOSIS — Z9221 Personal history of antineoplastic chemotherapy: Secondary | ICD-10-CM | POA: Insufficient documentation

## 2013-12-21 DIAGNOSIS — C785 Secondary malignant neoplasm of large intestine and rectum: Secondary | ICD-10-CM | POA: Insufficient documentation

## 2013-12-21 DIAGNOSIS — G609 Hereditary and idiopathic neuropathy, unspecified: Secondary | ICD-10-CM | POA: Insufficient documentation

## 2013-12-21 DIAGNOSIS — I10 Essential (primary) hypertension: Secondary | ICD-10-CM | POA: Insufficient documentation

## 2013-12-21 DIAGNOSIS — K219 Gastro-esophageal reflux disease without esophagitis: Secondary | ICD-10-CM | POA: Insufficient documentation

## 2013-12-21 DIAGNOSIS — E7800 Pure hypercholesterolemia, unspecified: Secondary | ICD-10-CM | POA: Insufficient documentation

## 2013-12-21 DIAGNOSIS — Z9089 Acquired absence of other organs: Secondary | ICD-10-CM | POA: Insufficient documentation

## 2013-12-21 NOTE — Progress Notes (Signed)
Consult Note: Gyn-Onc   [name] [name] 63 y.o. 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 Clinically free of disease.  Plan the patient return to see Dr. Hinton Rao in 3 months and return to see [place] in 6 months.. Would recommend obtain CA 125 values every 3 months as well.    Interval History: The patient returns today for continuing followup of stage IIIc fallopian tube cancer. She saw Dr. Hinton Rao 3 months ago . Patient herself  feels quite well. She has good energy and functional status is normal. She does have some mild residual neuropathy in her feet and hands. CA 125 on 12/14/2013 was 4.9 units per mL.  HPI:63 year old white female initially seen in consultation request of Dr. Hosie Poisson of [place] regarding management of new onset of ascites, malignant pleural effusion, and an omental cake. Patient reported that shortly after Memorial Day 2013 she developed rapid onset of abdominal distention. A CT scan was been obtained showing pleural effusion, significant abdominal ascites, an omental cake and carcinomatosis. A liter of pleural effusion was removed as well as a liter and a half from the abdomen. Cytology showed malignant cells consistent with adenocarcinoma. CA 125 value was 656 units per mL CA 27.29 was 208 units per mL CEA was less than 0.3.   On 06/06/2012 the patient underwent exploratory laparotomy. She was found to have diffuse metastatic high-grade serous carcinoma arising from the fallopian tube. At the time of surgery she had 6.5 L of ascites, the omentum was completely replaced by metastatic disease and diaphragms had metastatic disease on both surfaces. There was also considerable amount of disease in the pelvic peritoneum and bladder flap, precluding abdominal hysterectomy.. There is also considerable amount of metastatic disease around the appendix. Appendectomy, total omentectomy, and left salpingo-oophorectomy were performed. The  patient was suboptimally debulked.[name] [name]  Postoperatively the patient received 6 cycles of carboplatin and Taxol chemotherapy. At the completion of chemotherapy her CA 125 level was 10.6 units per mL and a followup CT scan showed no evidence of disease in the abdomen or pelvis and only residual small right pleural effusion (November 2013).   Review of Systems:10 point review of systems is negative as noted above.   Vitals: Blood pressure 160/69, pulse 82, temperature 98 F (36.7 C), resp. rate 16, height 5\' 5"  (1.651 m), weight 186 lb 12.8 oz (84.732 kg).  Physical Exam: General : The patient is a healthy woman in no acute distress.  HEENT: normocephalic, extraoccular movements normal; neck is supple without thyromegally  Lynphnodes: Supraclavicular and inguinal nodes not enlarged  Abdomen: Soft, non-tender, no ascites, no organomegally, no masses, no hernias  Pelvic:  EGBUS: Normal female  Vagina: Normal, no lesions  Urethra and Bladder: Normal, non-tender  Cervix: Normal Uterus: Normal  Bi-manual examination: Non-tender; no adenxal masses or nodularity  Rectal: normal sphincter tone, no masses, no blood  Lower extremities: No edema or varicosities. 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 Overall the patient sees be clinically free of diseaseShe has mild peripheral neuropathy and I would recommend she began taking vitamin B6 100 mg twice a day.  She will return to see Dr. Hinton Rao in 3 months.  I will plan on seeing the patient back again in 6 months or as needed.  No Known Allergies  Past Medical History  Diagnosis Date  . Breast cancer   .  Hypertension   . Hypercholesteremia   . Ascites   . Shortness of breath     some due to ascites   . GERD (gastroesophageal reflux disease)   . H/O hiatal hernia   . Anemia   . Lymph edema     left arm   .  Collapsed vertebra     thoracic to lumbar vertebra   . Arthritis     arthritis in back     Past Surgical History  Procedure Laterality Date  . Mastectomy, partial  Jan 2005    Left  . Uterine fibroid surgery  2003  . Eye surgery      as a child   . Laparotomy  06/06/2012    Procedure: EXPLORATORY LAPAROTOMY;  Surgeon: Alvino Chapel, MD;  Location: [facility];  Service: Gynecology;  Laterality: N/A;  . Appendectomy  06/06/2012    Procedure: APPENDECTOMY;  Surgeon: Alvino Chapel, MD;  Location: [facility];  Service: Gynecology;;  . Salpingoophorectomy  06/06/2012    Procedure: SALPINGO OOPHERECTOMY;  Surgeon: Alvino Chapel, MD;  Location: [facility];  Service: Gynecology;  Laterality: Left;    Current Outpatient Prescriptions  Medication Sig Dispense Refill  . cetirizine (ZYRTEC) 10 MG tablet Take 10 mg by mouth as needed.      . Cyanocobalamin (B-12 PO) Take 1 tablet by mouth daily.      [name] [name] losartan (COZAAR) 100 MG tablet Take 100 mg by mouth daily with breakfast.      . metoprolol (LOPRESSOR) 50 MG tablet Take 50 mg by mouth 2 (two) times daily.      . simvastatin (ZOCOR) 40 MG tablet Take 40 mg by mouth every evening.      [name] [name] aspirin 81 MG chewable tablet Chew 81 mg by mouth every other day.      . Ferrous Fum-Iron Polysacch (TANDEM PO) Take 1 tablet by mouth daily.      . ferrous sulfate (IRON SUPPLEMENT) 325 (65 FE) MG tablet Take 325 mg by mouth daily with breakfast. [facility]       No current facility-administered medications for this visit.    History   Social History  . Marital Status: Married    Spouse Name: N/A    Number of Children: N/A  . Years of Education: N/A   Occupational History  . Not on file.   Social History Main Topics  . Smoking status: Former Smoker    Quit date: 12/06/2002  . Smokeless tobacco: Never Used  . Alcohol Use: No  . Drug Use: No  . Sexual Activity: Not on file   Other Topics Concern  . Not on file   Social History  Narrative  . No narrative on file    Family History  Problem Relation Age of Onset  . Colon cancer Mother   . Uterine cancer Sister   . Uterine cancer Daughter       Alvino Chapel, MD 12/21/2013, 9:56 AM

## 2013-12-21 NOTE — Patient Instructions (Signed)
Please call in April or May to schedule on appointment with Dr. Fermin Schwab in July 2015.  Please call for any questions or concerns.  Follow up with Dr. Hinton Rao as scheduled.

## 2014-06-21 ENCOUNTER — Encounter: Payer: Self-pay | Admitting: Gynecology

## 2014-06-21 ENCOUNTER — Ambulatory Visit: Payer: Medicare HMO

## 2014-06-21 ENCOUNTER — Ambulatory Visit: Payer: Medicare HMO | Attending: Gynecology | Admitting: Gynecology

## 2014-06-21 VITALS — BP 157/66 | HR 79 | Temp 98.4°F | Resp 16 | Ht 65.0 in | Wt 183.9 lb

## 2014-06-21 DIAGNOSIS — I10 Essential (primary) hypertension: Secondary | ICD-10-CM | POA: Insufficient documentation

## 2014-06-21 DIAGNOSIS — K219 Gastro-esophageal reflux disease without esophagitis: Secondary | ICD-10-CM | POA: Diagnosis not present

## 2014-06-21 DIAGNOSIS — N9489 Other specified conditions associated with female genital organs and menstrual cycle: Secondary | ICD-10-CM | POA: Diagnosis not present

## 2014-06-21 DIAGNOSIS — Z9010 Acquired absence of unspecified breast and nipple: Secondary | ICD-10-CM | POA: Diagnosis not present

## 2014-06-21 DIAGNOSIS — Z800 Family history of malignant neoplasm of digestive organs: Secondary | ICD-10-CM | POA: Diagnosis not present

## 2014-06-21 DIAGNOSIS — Z853 Personal history of malignant neoplasm of breast: Secondary | ICD-10-CM | POA: Insufficient documentation

## 2014-06-21 DIAGNOSIS — Z8049 Family history of malignant neoplasm of other genital organs: Secondary | ICD-10-CM | POA: Diagnosis not present

## 2014-06-21 DIAGNOSIS — Z87891 Personal history of nicotine dependence: Secondary | ICD-10-CM | POA: Insufficient documentation

## 2014-06-21 DIAGNOSIS — Z09 Encounter for follow-up examination after completed treatment for conditions other than malignant neoplasm: Secondary | ICD-10-CM | POA: Insufficient documentation

## 2014-06-21 DIAGNOSIS — Z9079 Acquired absence of other genital organ(s): Secondary | ICD-10-CM | POA: Insufficient documentation

## 2014-06-21 DIAGNOSIS — E7800 Pure hypercholesterolemia, unspecified: Secondary | ICD-10-CM | POA: Diagnosis not present

## 2014-06-21 DIAGNOSIS — C5700 Malignant neoplasm of unspecified fallopian tube: Secondary | ICD-10-CM | POA: Insufficient documentation

## 2014-06-21 LAB — BUN AND CREATININE (CC13)
BUN: 11.3 mg/dL (ref 7.0–26.0)
Creatinine: 0.8 mg/dL (ref 0.6–1.1)

## 2014-06-21 NOTE — Patient Instructions (Addendum)
We'll obtain a repeat CA 125 and call you with the those results we will contact you June 26, 2014 at 0830am you will have a CT scan at [facility].  Please pick up contrast prior to this appt at [facility].

## 2014-06-21 NOTE — Progress Notes (Signed)
Consult Note: Gyn-Onc   [name] [name] 64 y.o. 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 Suspicious mass at the vaginal apex.  Plan despite the fact that the patient CA 125 has been normal, the mass I palpated the vaginal apexes suspicious. I do not believe it is stool and therefore recommend we obtain a CT scan of the abdomen and pelvis for further assessment. I will also repeat a CA 125 today. With good results of the CT scan we'll communicate with the patient.   Interval History: The patient returns today for continuing followup of stage IIIc fallopian tube cancer. She saw Dr. Hinton Rao 3 months ago. At that time her CA 125 value was 6.9 units per mL. . Patient herself  feels quite well. She has good energy and functional status is normal. She does have some mild residual neuropathy in her feet and hands. CA 125 just prior to this visit was repeated and 6.2 units per mL. She has no GI GU or pelvic symptoms.[name]  HPI:63 year old white female initially seen in consultation request of Dr. Hosie Poisson of [place] regarding management of new onset of ascites, malignant pleural effusion, and an omental cake. Patient reported that shortly after Memorial Day 2013 she developed rapid onset of abdominal distention. A CT scan was been obtained showing pleural effusion, significant abdominal ascites, an omental cake and carcinomatosis. A liter of pleural effusion was removed as well as a liter and a half from the abdomen. Cytology showed malignant cells consistent with adenocarcinoma. CA 125 value was 656 units per mL CA 27.29 was 208 units per mL CEA was less than 0.3.   On 06/06/2012 the patient underwent exploratory laparotomy. She was found to have diffuse metastatic high-grade serous carcinoma arising from the fallopian tube. At the time of surgery she had 6.5 L of ascites, the omentum was completely replaced by metastatic disease and diaphragms had metastatic  disease on both surfaces. There was also considerable amount of disease in the pelvic peritoneum and bladder flap, precluding abdominal hysterectomy.. There is also considerable amount of metastatic disease around the appendix. Appendectomy, total omentectomy, and left salpingo-oophorectomy were performed. The patient was suboptimally debulked.[name]  Postoperatively the patient received 6 cycles of carboplatin and Taxol chemotherapy. At the completion of chemotherapy her CA 125 level was 10.6 units per mL and a followup CT scan showed no evidence of disease in the abdomen or pelvis and only residual small right pleural effusion (November 2013).   Review of Systems:10 point review of systems is negative as noted above.   Vitals: Blood pressure 157/66, pulse 79, temperature 98.4 F (36.9 C), temperature source Oral, resp. rate 16, height 5\' 5"  (1.651 m), weight 183 lb 14.4 oz (83.416 kg).  Physical Exam: General : The patient is a healthy woman in no acute distress.  HEENT: normocephalic, extraoccular movements normal; neck is supple without thyromegally  Lynphnodes: Supraclavicular and inguinal nodes not enlarged  Abdomen: Soft, non-tender, no ascites, no organomegally, no masses, no hernias  Pelvic:  EGBUS: Normal female  Vagina: Normal mucosa. Urethra and Bladder: Normal, non-tender  Cervix: Normal Uterus: Normal  Bi-manual examination: There is approximately a 3-4 cm mass at the apex of the vagina. I do not think this is stool in the colon. It is nontender. Rectal: normal sphincter tone, no masses, no stool, no blood  Lower extremities: No edema or varicosities. Normal range of motion     No Known Allergies  Past Medical History  Diagnosis Date  . Breast cancer   . Hypertension   . Hypercholesteremia   . Ascites   . Shortness of breath     some due to ascites   . GERD (gastroesophageal reflux disease)   . H/O hiatal hernia   . Anemia   . Lymph edema     left arm   . Collapsed  vertebra     thoracic to lumbar vertebra   . Arthritis     arthritis in back     Past Surgical History  Procedure Laterality Date  . Mastectomy, partial  Jan 2005    Left  . Uterine fibroid surgery  2003  . Eye surgery      as a child   . Laparotomy  06/06/2012    Procedure: EXPLORATORY LAPAROTOMY;  Surgeon: Alvino Chapel, MD;  Location: [facility];  Service: Gynecology;  Laterality: N/A;  . Appendectomy  06/06/2012    Procedure: APPENDECTOMY;  Surgeon: Alvino Chapel, MD;  Location: [facility];  Service: Gynecology;;  . Salpingoophorectomy  06/06/2012    Procedure: SALPINGO OOPHERECTOMY;  Surgeon: Alvino Chapel, MD;  Location: [facility];  Service: Gynecology;  Laterality: Left;    Current Outpatient Prescriptions  Medication Sig Dispense Refill  . aspirin 81 MG chewable tablet Chew 81 mg by mouth every other day.      . cetirizine (ZYRTEC) 10 MG tablet Take 10 mg by mouth as needed.      . Cyanocobalamin (B-12 PO) Take 1 tablet by mouth daily.      . Ferrous Fum-Iron Polysacch (TANDEM PO) Take 1 tablet by mouth daily.      . ferrous sulfate (IRON SUPPLEMENT) 325 (65 FE) MG tablet Take 325 mg by mouth daily with breakfast. [employer]      . losartan (COZAAR) 100 MG tablet Take 100 mg by mouth daily with breakfast.      . metoprolol (LOPRESSOR) 50 MG tablet Take 50 mg by mouth 2 (two) times daily.      . simvastatin (ZOCOR) 40 MG tablet Take 40 mg by mouth every evening.       No current facility-administered medications for this visit.    History   Social History  . Marital Status: Married    Spouse Name: N/A    Number of Children: N/A  . Years of Education: N/A   Occupational History  . Not on file.   Social History Main Topics  . Smoking status: Former Smoker    Quit date: 12/06/2002  . Smokeless tobacco: Never Used  . Alcohol Use: No  . Drug Use: No  . Sexual Activity: Not Currently   Other Topics Concern  . Not on file   Social History Narrative   . No narrative on file    Family History  Problem Relation Age of Onset  . Colon cancer Mother   . Uterine cancer Sister   . Uterine cancer Daughter       Alvino Chapel, MD 06/21/2014, 10:56 AM

## 2014-06-22 LAB — CA 125: CA 125: 4.5 U/mL (ref 0.0–30.2)

## 2014-07-16 ENCOUNTER — Telehealth: Payer: Self-pay | Admitting: Gynecologic Oncology

## 2014-07-16 NOTE — Telephone Encounter (Signed)
Patient called asking about CT scan results that were faxed from [facility].  Informed of results along with CA 125.  Advised that she would be contacted with further recommendations from Dr. Fermin Schwab when he reviewed the scan.  Advised to call for any questions or concerns.

## 2014-09-13 ENCOUNTER — Ambulatory Visit: Payer: Medicare HMO | Admitting: Gynecology

## 2014-10-04 ENCOUNTER — Ambulatory Visit: Payer: Medicare HMO | Attending: Gynecology | Admitting: Gynecology

## 2014-10-04 ENCOUNTER — Encounter: Payer: Self-pay | Admitting: Gynecology

## 2014-10-04 VITALS — BP 153/66 | HR 72 | Temp 97.9°F | Resp 18 | Wt 182.6 lb

## 2014-10-04 DIAGNOSIS — I10 Essential (primary) hypertension: Secondary | ICD-10-CM | POA: Diagnosis not present

## 2014-10-04 DIAGNOSIS — Z08 Encounter for follow-up examination after completed treatment for malignant neoplasm: Secondary | ICD-10-CM | POA: Diagnosis not present

## 2014-10-04 DIAGNOSIS — Z8589 Personal history of malignant neoplasm of other organs and systems: Secondary | ICD-10-CM | POA: Insufficient documentation

## 2014-10-04 DIAGNOSIS — Z853 Personal history of malignant neoplasm of breast: Secondary | ICD-10-CM | POA: Insufficient documentation

## 2014-10-04 DIAGNOSIS — Z90721 Acquired absence of ovaries, unilateral: Secondary | ICD-10-CM | POA: Diagnosis not present

## 2014-10-04 DIAGNOSIS — Z9012 Acquired absence of left breast and nipple: Secondary | ICD-10-CM | POA: Insufficient documentation

## 2014-10-04 DIAGNOSIS — K219 Gastro-esophageal reflux disease without esophagitis: Secondary | ICD-10-CM | POA: Diagnosis not present

## 2014-10-04 DIAGNOSIS — C5700 Malignant neoplasm of unspecified fallopian tube: Secondary | ICD-10-CM

## 2014-10-04 DIAGNOSIS — Z87891 Personal history of nicotine dependence: Secondary | ICD-10-CM | POA: Insufficient documentation

## 2014-10-04 DIAGNOSIS — Z8544 Personal history of malignant neoplasm of other female genital organs: Secondary | ICD-10-CM

## 2014-10-04 DIAGNOSIS — E780 Pure hypercholesterolemia: Secondary | ICD-10-CM | POA: Insufficient documentation

## 2014-10-04 NOTE — Progress Notes (Signed)
Consult Note: Gyn-Onc   [name] [name] 63 y.o. 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 Clinically free of disease.  Plan  patient will see Dr. Hinton Rao in 6 months return to see me in one year. We will continue to monitor CA-125 values at those visits. Signs and symptoms are recurrent ovarian cancer were reviewed and the patient should contact us if she has any difficulties.  Interval History: The patient returns today for continuing followup of stage IIIc fallopian tube cancer. She saw Dr. Hinton Rao 3 months ago. Since then she's done well she denies any GI or GU symptoms she denies any pelvic symptoms. CA-125 value on 09/18/2014 was 7.9 units per mL. HPI:62 year old white female initially seen in consultation request of Dr. Hosie Poisson of [place] regarding management of new onset of ascites, malignant pleural effusion, and an omental cake. Patient reported that shortly after Memorial Day 2013 she developed rapid onset of abdominal distention. A CT scan was been obtained showing pleural effusion, significant abdominal ascites, an omental cake and carcinomatosis. A liter of pleural effusion was removed as well as a liter and a half from the abdomen. Cytology showed malignant cells consistent with adenocarcinoma. CA 125 value was 656 units per mL CA 27.29 was 208 units per mL CEA was less than 0.3.   On 06/06/2012 the patient underwent exploratory laparotomy. She was found to have diffuse metastatic high-grade serous carcinoma arising from the fallopian tube. At the time of surgery she had 6.5 L of ascites, the omentum was completely replaced by metastatic disease and diaphragms had metastatic disease on both surfaces. There was also considerable amount of disease in the pelvic peritoneum and bladder flap, precluding abdominal hysterectomy.. There is also considerable amount of metastatic disease around the appendix. Appendectomy, total omentectomy, and left  salpingo-oophorectomy were performed. The patient was suboptimally debulked.[name]  Postoperatively the patient received 6 cycles of carboplatin and Taxol chemotherapy. At the completion of chemotherapy her CA 125 level was 10.6 units per mL and a followup CT scan showed no evidence of disease in the abdomen or pelvis and only residual small right pleural effusion (November 2013).   Review of Systems:10 point review of systems is negative as noted above.   Vitals: Blood pressure 153/66, pulse 72, temperature 97.9 F (36.6 C), resp. rate 18, weight 182 lb 9.6 oz (82.827 kg).  Physical Exam: General : The patient is a healthy woman in no acute distress.  HEENT: normocephalic, extraoccular movements normal; neck is supple without thyromegally  Lynphnodes: Supraclavicular and inguinal nodes not enlarged  Abdomen: Soft, non-tender, no ascites, no organomegally, no masses, no hernias  Pelvic:  EGBUS: Normal female  Vagina: Normal mucosa. Urethra and Bladder: Normal, non-tender  Cervix: Normal Uterus: Normal  Bi-manual examination: No masses or nodularity. Rectal: normal sphincter tone, no masses, no stool, no blood  Lower extremities: No edema or varicosities. Normal range of motion     No Known Allergies  Past Medical History  Diagnosis Date  . Breast cancer   . Hypertension   . Hypercholesteremia   . Ascites   . Shortness of breath     some due to ascites   . GERD (gastroesophageal reflux disease)   . H/O hiatal hernia   . Anemia   . Lymph edema     left arm   . Collapsed vertebra     thoracic to lumbar vertebra   . Arthritis     arthritis in back  Past Surgical History  Procedure Laterality Date  . Mastectomy, partial  Jan 2005    Left  . Uterine fibroid surgery  2003  . Eye surgery      as a child   . Laparotomy  06/06/2012    Procedure: EXPLORATORY LAPAROTOMY;  Surgeon: Alvino Chapel, MD;  Location: [facility];  Service: Gynecology;  Laterality: N/A;  .  Appendectomy  06/06/2012    Procedure: APPENDECTOMY;  Surgeon: Alvino Chapel, MD;  Location: [facility];  Service: Gynecology;;  . Salpingoophorectomy  06/06/2012    Procedure: SALPINGO OOPHERECTOMY;  Surgeon: Alvino Chapel, MD;  [facility]: [facility];  Service: Gynecology;  Laterality: Left;    Current Outpatient Prescriptions  Medication Sig Dispense Refill  . glimepiride (AMARYL) 1 MG tablet       . losartan (COZAAR) 100 MG tablet Take 100 mg by mouth daily with breakfast.      . metFORMIN (GLUCOPHAGE-XR) 500 MG 24 hr tablet       . metoprolol (LOPRESSOR) 50 MG tablet Take 50 mg by mouth 2 (two) times daily.      . ranitidine (ZANTAC) 150 MG tablet Take 150 mg by mouth 2 (two) times daily.      [name] RELION CONFIRM/MICRO TEST test strip       . simvastatin (ZOCOR) 40 MG tablet Take 40 mg by mouth every evening.       No current facility-administered medications for this visit.    History   Social History  . Marital Status: Married    Spouse Name: N/A    Number of Children: N/A  . Years of Education: N/A   Occupational History  . Not on file.   Social History Main Topics  . Smoking status: Former Smoker    Quit date: 12/06/2002  . Smokeless tobacco: Never Used  . Alcohol Use: No  . Drug Use: No  . Sexual Activity: Not Currently   Other Topics Concern  . Not on file   Social History Narrative  . No narrative on file    Family History  Problem Relation Age of Onset  . Colon cancer Mother   . Uterine cancer Sister   . Uterine cancer Daughter       Alvino Chapel, MD 10/04/2014, 11:19 AM

## 2014-10-04 NOTE — Patient Instructions (Signed)
Return to see [place] in one year. Please see Dr. Hinton Rao in 6 months.

## 2015-01-10 DIAGNOSIS — Z1231 Encounter for screening mammogram for malignant neoplasm of breast: Secondary | ICD-10-CM | POA: Diagnosis not present

## 2015-01-22 DIAGNOSIS — R922 Inconclusive mammogram: Secondary | ICD-10-CM | POA: Diagnosis not present

## 2015-01-22 DIAGNOSIS — Z9012 Acquired absence of left breast and nipple: Secondary | ICD-10-CM | POA: Diagnosis not present

## 2015-01-22 DIAGNOSIS — R928 Other abnormal and inconclusive findings on diagnostic imaging of breast: Secondary | ICD-10-CM | POA: Diagnosis not present

## 2015-01-27 DIAGNOSIS — N6031 Fibrosclerosis of right breast: Secondary | ICD-10-CM | POA: Diagnosis not present

## 2015-01-27 DIAGNOSIS — N63 Unspecified lump in breast: Secondary | ICD-10-CM | POA: Diagnosis not present

## 2015-01-27 DIAGNOSIS — N6011 Diffuse cystic mastopathy of right breast: Secondary | ICD-10-CM | POA: Diagnosis not present

## 2015-01-27 DIAGNOSIS — N6001 Solitary cyst of right breast: Secondary | ICD-10-CM | POA: Diagnosis not present

## 2015-01-27 DIAGNOSIS — N6091 Unspecified benign mammary dysplasia of right breast: Secondary | ICD-10-CM | POA: Diagnosis not present

## 2015-03-18 DIAGNOSIS — I972 Postmastectomy lymphedema syndrome: Secondary | ICD-10-CM | POA: Diagnosis not present

## 2015-03-18 DIAGNOSIS — Z853 Personal history of malignant neoplasm of breast: Secondary | ICD-10-CM | POA: Diagnosis not present

## 2015-03-18 DIAGNOSIS — C5702 Malignant neoplasm of left fallopian tube: Secondary | ICD-10-CM | POA: Diagnosis not present

## 2015-03-20 DIAGNOSIS — C5702 Malignant neoplasm of left fallopian tube: Secondary | ICD-10-CM | POA: Diagnosis not present

## 2015-03-20 DIAGNOSIS — I972 Postmastectomy lymphedema syndrome: Secondary | ICD-10-CM | POA: Diagnosis not present

## 2015-03-20 DIAGNOSIS — Z853 Personal history of malignant neoplasm of breast: Secondary | ICD-10-CM | POA: Diagnosis not present

## 2015-07-23 DIAGNOSIS — R928 Other abnormal and inconclusive findings on diagnostic imaging of breast: Secondary | ICD-10-CM | POA: Diagnosis not present

## 2015-07-23 DIAGNOSIS — R922 Inconclusive mammogram: Secondary | ICD-10-CM | POA: Diagnosis not present

## 2015-08-05 DIAGNOSIS — Z23 Encounter for immunization: Secondary | ICD-10-CM | POA: Diagnosis not present

## 2015-08-05 DIAGNOSIS — E780 Pure hypercholesterolemia: Secondary | ICD-10-CM | POA: Diagnosis not present

## 2015-08-05 DIAGNOSIS — E1165 Type 2 diabetes mellitus with hyperglycemia: Secondary | ICD-10-CM | POA: Diagnosis not present

## 2015-08-05 DIAGNOSIS — I10 Essential (primary) hypertension: Secondary | ICD-10-CM | POA: Diagnosis not present

## 2015-08-05 DIAGNOSIS — C8330 Diffuse large B-cell lymphoma, unspecified site: Secondary | ICD-10-CM | POA: Diagnosis not present

## 2015-08-05 DIAGNOSIS — E1169 Type 2 diabetes mellitus with other specified complication: Secondary | ICD-10-CM | POA: Diagnosis not present

## 2015-08-05 DIAGNOSIS — Z0000 Encounter for general adult medical examination without abnormal findings: Secondary | ICD-10-CM | POA: Diagnosis not present

## 2015-08-05 DIAGNOSIS — Z79899 Other long term (current) drug therapy: Secondary | ICD-10-CM | POA: Diagnosis not present

## 2015-09-18 DIAGNOSIS — R928 Other abnormal and inconclusive findings on diagnostic imaging of breast: Secondary | ICD-10-CM | POA: Diagnosis not present

## 2015-09-18 DIAGNOSIS — Z853 Personal history of malignant neoplasm of breast: Secondary | ICD-10-CM | POA: Diagnosis not present

## 2015-09-18 DIAGNOSIS — Z8544 Personal history of malignant neoplasm of other female genital organs: Secondary | ICD-10-CM | POA: Diagnosis not present

## 2015-09-18 DIAGNOSIS — D649 Anemia, unspecified: Secondary | ICD-10-CM | POA: Diagnosis not present

## 2015-10-03 ENCOUNTER — Encounter: Payer: Self-pay | Admitting: Gynecology

## 2015-10-03 ENCOUNTER — Ambulatory Visit: Payer: Commercial Managed Care - HMO | Attending: Gynecology | Admitting: Gynecology

## 2015-10-03 VITALS — BP 140/69 | HR 88 | Temp 98.6°F | Resp 18 | Ht 65.0 in | Wt 188.0 lb

## 2015-10-03 DIAGNOSIS — C5700 Malignant neoplasm of unspecified fallopian tube: Secondary | ICD-10-CM | POA: Diagnosis not present

## 2015-10-03 DIAGNOSIS — Z8544 Personal history of malignant neoplasm of other female genital organs: Secondary | ICD-10-CM | POA: Diagnosis not present

## 2015-10-03 NOTE — Progress Notes (Signed)
Consult Note: Gyn-Onc   [name] [name] 64 y.o. 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 Clinically free of disease.  Plan  patient will see Dr. Hinton Rao in 6 months return to see me in one year. We will continue to monitor CA-125 values at those visits. Signs and symptoms are recurrent ovarian cancer were reviewed and the patient should contact us if she has any difficulties.  Interval History: The patient returns today for continuing followup of stage IIIc fallopian tube cancer. She saw Dr. Hinton Rao 3 months ago. Since then she's done well she denies any GI or GU symptoms she denies any pelvic symptoms. CA-125 value on 09/18/2015 was 6.9 units per mL.   HPI:64 year old white female initially seen in consultation request of Dr. Hosie Poisson of [place] regarding management of new onset of ascites, malignant pleural effusion, and an omental cake. Patient reported that shortly after Memorial Day 2013 she developed rapid onset of abdominal distention. A CT scan was been obtained showing pleural effusion, significant abdominal ascites, an omental cake and carcinomatosis. A liter of pleural effusion was removed as well as a liter and a half from the abdomen. Cytology showed malignant cells consistent with adenocarcinoma. CA 125 value was 656 units per mL CA 27.29 was 208 units per mL CEA was less than 0.3.   On 06/06/2012 the patient underwent exploratory laparotomy. She was found to have diffuse metastatic high-grade serous carcinoma arising from the fallopian tube. At the time of surgery she had 6.5 L of ascites, the omentum was completely replaced by metastatic disease and diaphragms had metastatic disease on both surfaces. There was also considerable amount of disease in the pelvic peritoneum and bladder flap, precluding abdominal hysterectomy.. There is also considerable amount of metastatic disease around the appendix. Appendectomy, total omentectomy,  and left salpingo-oophorectomy were performed. The patient was suboptimally debulked.[name]  Postoperatively the patient received 6 cycles of carboplatin and Taxol chemotherapy. At the completion of chemotherapy her CA 125 level was 10.6 units per mL and a followup CT scan showed no evidence of disease in the abdomen or pelvis and only residual small right pleural effusion (November 2013).   Review of Systems:10 point review of systems is negative as noted above.   Vitals: Blood pressure 140/69, pulse 88, temperature 98.6 F (37 C), temperature source Oral, resp. rate 18, height 5\' 5"  (1.651 m), weight 188 lb (85.276 kg), SpO2 100 %.  Physical Exam: General : The patient is a healthy woman in no acute distress.  HEENT: normocephalic, extraoccular movements normal; neck is supple without thyromegally  Lynphnodes: Supraclavicular and inguinal nodes not enlarged  Abdomen: Soft, non-tender, no ascites, no organomegally, no masses, no hernias  Pelvic:  EGBUS: Normal female  Vagina: Normal mucosa. Urethra and Bladder: Normal, non-tender  Cervix: Normal Uterus: Normal  Bi-manual examination: No masses or nodularity. Rectal: normal sphincter tone, no masses, no stool, no blood  Lower extremities: No edema or varicosities. Normal range of motion     No Known Allergies  Past Medical History  Diagnosis Date  . Breast cancer ([place])   . Hypertension   . Hypercholesteremia   . Ascites   . Shortness of breath     some due to ascites   . GERD (gastroesophageal reflux disease)   . H/O hiatal hernia   . Anemia   . Lymph edema     left arm   . Collapsed vertebra ([place])     thoracic to lumbar  vertebra   . Arthritis     arthritis in back     Past Surgical History  Procedure Laterality Date  . Mastectomy, partial  Jan 2005    Left  . Uterine fibroid surgery  2003  . Eye surgery      as a child   . Laparotomy  06/06/2012    Procedure: EXPLORATORY LAPAROTOMY;  Surgeon: Alvino Chapel,  MD;  Location: [facility];  Service: Gynecology;  Laterality: N/A;  . Appendectomy  06/06/2012    Procedure: APPENDECTOMY;  Surgeon: Alvino Chapel, MD;  Location: [facility];  Service: Gynecology;;  . Salpingoophorectomy  06/06/2012    Procedure: SALPINGO OOPHERECTOMY;  Surgeon: Alvino Chapel, MD;  Location: [facility];  Service: Gynecology;  Laterality: Left;    Current Outpatient Prescriptions  Medication Sig Dispense Refill  . cetirizine (ZYRTEC) 10 MG tablet Take 10 mg by mouth daily.    [name] glimepiride (AMARYL) 2 MG tablet Take 1 mg by mouth daily with breakfast.     . metFORMIN (GLUCOPHAGE-XR) 500 MG 24 hr tablet     . metoprolol (LOPRESSOR) 50 MG tablet Take 50 mg by mouth 2 (two) times daily.    . metoprolol succinate (TOPROL-XL) 100 MG 24 hr tablet     . ranitidine (ZANTAC) 150 MG tablet Take 150 mg by mouth 2 (two) times daily.    [name] RELION CONFIRM/MICRO TEST test strip     . simvastatin (ZOCOR) 40 MG tablet Take 40 mg by mouth every evening.    . vitamin B-12 (CYANOCOBALAMIN) 500 MCG tablet Take 500 mcg by mouth daily.    [name] losartan (COZAAR) 100 MG tablet Take 100 mg by mouth daily with breakfast.     No current facility-administered medications for this visit.    Social History   Social History  . Marital Status: Married    Spouse Name: N/A  . Number of Children: N/A  . Years of Education: N/A   Occupational History  . Not on file.   Social History Main Topics  . Smoking status: Former Smoker    Quit date: 12/06/2002  . Smokeless tobacco: Never Used  . Alcohol Use: No  . Drug Use: No  . Sexual Activity: Not Currently   Other Topics Concern  . Not on file   Social History Narrative    Family History  Problem Relation Age of Onset  . Colon cancer Mother   . Uterine cancer Sister   . Uterine cancer Daughter       Alvino Chapel, MD 10/03/2015, 11:49 AM

## 2015-10-03 NOTE — Patient Instructions (Signed)
Follow-up with Dr. Fermin Schwab in 1 year (November 2017). Please call our office in June or July to schedule this appointment or sooner with any questions or concerns.

## 2016-01-12 DIAGNOSIS — Z1231 Encounter for screening mammogram for malignant neoplasm of breast: Secondary | ICD-10-CM | POA: Diagnosis not present

## 2016-01-12 DIAGNOSIS — Z923 Personal history of irradiation: Secondary | ICD-10-CM | POA: Diagnosis not present

## 2016-01-12 DIAGNOSIS — Z9221 Personal history of antineoplastic chemotherapy: Secondary | ICD-10-CM | POA: Diagnosis not present

## 2016-01-12 DIAGNOSIS — Z853 Personal history of malignant neoplasm of breast: Secondary | ICD-10-CM | POA: Diagnosis not present

## 2016-03-15 DIAGNOSIS — R971 Elevated cancer antigen 125 [CA 125]: Secondary | ICD-10-CM | POA: Diagnosis not present

## 2016-03-15 DIAGNOSIS — C5702 Malignant neoplasm of left fallopian tube: Secondary | ICD-10-CM | POA: Diagnosis not present

## 2016-03-17 DIAGNOSIS — E119 Type 2 diabetes mellitus without complications: Secondary | ICD-10-CM | POA: Diagnosis not present

## 2016-03-17 DIAGNOSIS — Z853 Personal history of malignant neoplasm of breast: Secondary | ICD-10-CM | POA: Diagnosis not present

## 2016-03-17 DIAGNOSIS — E538 Deficiency of other specified B group vitamins: Secondary | ICD-10-CM | POA: Diagnosis not present

## 2016-03-17 DIAGNOSIS — Z8544 Personal history of malignant neoplasm of other female genital organs: Secondary | ICD-10-CM | POA: Diagnosis not present

## 2016-06-07 DIAGNOSIS — J0120 Acute ethmoidal sinusitis, unspecified: Secondary | ICD-10-CM | POA: Diagnosis not present

## 2016-08-03 DIAGNOSIS — I10 Essential (primary) hypertension: Secondary | ICD-10-CM | POA: Diagnosis not present

## 2016-08-03 DIAGNOSIS — E119 Type 2 diabetes mellitus without complications: Secondary | ICD-10-CM | POA: Diagnosis not present

## 2016-08-03 DIAGNOSIS — Z853 Personal history of malignant neoplasm of breast: Secondary | ICD-10-CM | POA: Diagnosis not present

## 2016-08-03 DIAGNOSIS — Z1389 Encounter for screening for other disorder: Secondary | ICD-10-CM | POA: Diagnosis not present

## 2016-08-03 DIAGNOSIS — E785 Hyperlipidemia, unspecified: Secondary | ICD-10-CM | POA: Diagnosis not present

## 2016-08-03 DIAGNOSIS — E663 Overweight: Secondary | ICD-10-CM | POA: Diagnosis not present

## 2016-08-03 DIAGNOSIS — Z79899 Other long term (current) drug therapy: Secondary | ICD-10-CM | POA: Diagnosis not present

## 2016-08-03 DIAGNOSIS — Z9181 History of falling: Secondary | ICD-10-CM | POA: Diagnosis not present

## 2016-08-03 DIAGNOSIS — G629 Polyneuropathy, unspecified: Secondary | ICD-10-CM | POA: Diagnosis not present

## 2016-09-16 DIAGNOSIS — Z853 Personal history of malignant neoplasm of breast: Secondary | ICD-10-CM | POA: Diagnosis not present

## 2016-09-16 DIAGNOSIS — R978 Other abnormal tumor markers: Secondary | ICD-10-CM | POA: Diagnosis not present

## 2016-09-16 DIAGNOSIS — R928 Other abnormal and inconclusive findings on diagnostic imaging of breast: Secondary | ICD-10-CM | POA: Diagnosis not present

## 2016-09-16 DIAGNOSIS — D649 Anemia, unspecified: Secondary | ICD-10-CM | POA: Diagnosis not present

## 2016-09-16 DIAGNOSIS — Z8544 Personal history of malignant neoplasm of other female genital organs: Secondary | ICD-10-CM | POA: Diagnosis not present

## 2016-09-24 ENCOUNTER — Encounter: Payer: Self-pay | Admitting: Gynecology

## 2016-09-24 ENCOUNTER — Ambulatory Visit: Payer: Commercial Managed Care - HMO | Attending: Gynecology | Admitting: Gynecology

## 2016-09-24 VITALS — BP 165/69 | HR 87 | Temp 98.4°F | Resp 18 | Ht 65.0 in | Wt 185.3 lb

## 2016-09-24 DIAGNOSIS — Z9012 Acquired absence of left breast and nipple: Secondary | ICD-10-CM | POA: Diagnosis not present

## 2016-09-24 DIAGNOSIS — E7800 Pure hypercholesterolemia, unspecified: Secondary | ICD-10-CM | POA: Diagnosis not present

## 2016-09-24 DIAGNOSIS — Z800 Family history of malignant neoplasm of digestive organs: Secondary | ICD-10-CM | POA: Diagnosis not present

## 2016-09-24 DIAGNOSIS — D649 Anemia, unspecified: Secondary | ICD-10-CM | POA: Insufficient documentation

## 2016-09-24 DIAGNOSIS — Z8544 Personal history of malignant neoplasm of other female genital organs: Secondary | ICD-10-CM | POA: Diagnosis not present

## 2016-09-24 DIAGNOSIS — Z7984 Long term (current) use of oral hypoglycemic drugs: Secondary | ICD-10-CM | POA: Insufficient documentation

## 2016-09-24 DIAGNOSIS — Z853 Personal history of malignant neoplasm of breast: Secondary | ICD-10-CM | POA: Diagnosis not present

## 2016-09-24 DIAGNOSIS — J910 Malignant pleural effusion: Secondary | ICD-10-CM | POA: Diagnosis not present

## 2016-09-24 DIAGNOSIS — Z8049 Family history of malignant neoplasm of other genital organs: Secondary | ICD-10-CM | POA: Diagnosis not present

## 2016-09-24 DIAGNOSIS — M1990 Unspecified osteoarthritis, unspecified site: Secondary | ICD-10-CM | POA: Diagnosis not present

## 2016-09-24 DIAGNOSIS — Z90721 Acquired absence of ovaries, unilateral: Secondary | ICD-10-CM | POA: Insufficient documentation

## 2016-09-24 DIAGNOSIS — K219 Gastro-esophageal reflux disease without esophagitis: Secondary | ICD-10-CM | POA: Diagnosis not present

## 2016-09-24 DIAGNOSIS — I10 Essential (primary) hypertension: Secondary | ICD-10-CM | POA: Insufficient documentation

## 2016-09-24 DIAGNOSIS — Z87891 Personal history of nicotine dependence: Secondary | ICD-10-CM | POA: Diagnosis not present

## 2016-09-24 DIAGNOSIS — C5700 Malignant neoplasm of unspecified fallopian tube: Secondary | ICD-10-CM | POA: Diagnosis not present

## 2016-09-24 NOTE — Progress Notes (Signed)
Consult Note: Gyn-Onc   [name] [name] 65 y.o. 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 Clinically free of disease.  Plan  patient will see Dr. Hinton Rao in 6 months return to see me in one year. We will continue to monitor CA-125 values at those visits. Signs and symptoms are recurrent ovarian cancer were reviewed and the patient should contact us if she has any difficulties.  Interval History: The patient returns today for continuing followup of stage IIIc fallopian tube cancer. She saw Dr. Hinton Rao 3 months ago. Since then she's done well she denies any GI or GU symptoms she denies any pelvic symptoms. CA-125 value on 09/17/2016 was 4.8  units per mL.   HPI:65 year old white female initially seen in consultation request of Dr. Hosie Poisson of [place] regarding management of new onset of ascites, malignant pleural effusion, and an omental cake. Patient reported that shortly after Memorial Day 2013 she developed rapid onset of abdominal distention. A CT scan was been obtained showing pleural effusion, significant abdominal ascites, an omental cake and carcinomatosis. A liter of pleural effusion was removed as well as a liter and a half from the abdomen. Cytology showed malignant cells consistent with adenocarcinoma. CA 125 value was 656 units per mL CA 27.29 was 208 units per mL CEA was less than 0.3.   On 06/06/2012 the patient underwent exploratory laparotomy. She was found to have diffuse metastatic high-grade serous carcinoma arising from the fallopian tube. At the time of surgery she had 6.5 L of ascites, the omentum was completely replaced by metastatic disease and diaphragms had metastatic disease on both surfaces. There was also considerable amount of disease in the pelvic peritoneum and bladder flap, precluding abdominal hysterectomy.. There is also considerable amount of metastatic disease around the appendix. Appendectomy, total omentectomy,  and left salpingo-oophorectomy were performed. The patient was suboptimally debulked.[name]  Postoperatively the patient received 6 cycles of carboplatin and Taxol chemotherapy. At the completion of chemotherapy her CA 125 level was 10.6 units per mL and a followup CT scan showed no evidence of disease in the abdomen or pelvis and only residual small right pleural effusion (November 2013).   Review of Systems:10 point review of systems is negative as noted above.   Vitals: Blood pressure (!) 165/69, pulse 87, temperature 98.4 F (36.9 C), temperature source Oral, resp. rate 18, height 5\' 5"  (1.651 m), weight 185 lb 4.8 oz (84.1 kg), SpO2 100 %.  Physical Exam: General : The patient is a healthy woman in no acute distress.  HEENT: normocephalic, extraoccular movements normal; neck is supple without thyromegally  Lynphnodes: Supraclavicular and inguinal nodes not enlarged  Abdomen: Soft, non-tender, no ascites, no organomegally, no masses, no hernias  Pelvic:  EGBUS: Normal female  Vagina: Normal mucosa. Urethra and Bladder: Normal, non-tender  Cervix: Normal Uterus: Normal  Bi-manual examination: No masses or nodularity. Rectal: normal sphincter tone, no masses, no stool, no blood  Lower extremities: No edema or varicosities. Normal range of motion     No Known Allergies  Past Medical History:  Diagnosis Date  . Anemia   . Arthritis    arthritis in back   . Ascites   . Breast cancer ([place])   . Collapsed vertebra ([place])    thoracic to lumbar vertebra   . GERD (gastroesophageal reflux disease)   . H/O hiatal hernia   . Hypercholesteremia   . Hypertension   . Lymph edema    left arm   .  Shortness of breath    some due to ascites     Past Surgical History:  Procedure Laterality Date  . APPENDECTOMY  06/06/2012   Procedure: APPENDECTOMY;  Surgeon: Alvino Chapel, MD;  Location: [facility];  Service: Gynecology;;  . EYE SURGERY     as a child   . LAPAROTOMY  06/06/2012    Procedure: EXPLORATORY LAPAROTOMY;  Surgeon: Alvino Chapel, MD;  Location: [facility];  Service: Gynecology;  Laterality: N/A;  . MASTECTOMY, PARTIAL  Jan 2005   Left  . SALPINGOOPHORECTOMY  06/06/2012   Procedure: SALPINGO OOPHERECTOMY;  Surgeon: Alvino Chapel, MD;  Location: [facility];  Service: Gynecology;  Laterality: Left;  . UTERINE FIBROID SURGERY  2003    Current Outpatient Prescriptions  Medication Sig Dispense Refill  . cetirizine (ZYRTEC) 10 MG tablet Take 10 mg by mouth daily.    [name] glimepiride (AMARYL) 2 MG tablet Take 1 mg by mouth daily with breakfast.     . losartan (COZAAR) 100 MG tablet Take 100 mg by mouth daily with breakfast.    . metFORMIN (GLUCOPHAGE-XR) 500 MG 24 hr tablet     . metoprolol succinate (TOPROL-XL) 100 MG 24 hr tablet     . RELION CONFIRM/MICRO TEST test strip     . simvastatin (ZOCOR) 40 MG tablet Take 40 mg by mouth every evening.    . vitamin B-12 (CYANOCOBALAMIN) 500 MCG tablet Take 500 mcg by mouth daily.    . ranitidine (ZANTAC) 150 MG tablet Take 150 mg by mouth daily as needed.      No current facility-administered medications for this visit.     Social History   Social History  . Marital status: Married    Spouse name: N/A  . Number of children: N/A  . Years of education: N/A   Occupational History  . Not on file.   Social History Main Topics  . Smoking status: Former Smoker    Quit date: 12/06/2002  . Smokeless tobacco: Never Used  . Alcohol use No  . Drug use: No  . Sexual activity: Not Currently   Other Topics Concern  . Not on file   Social History Narrative  . No narrative on file    Family History  Problem Relation Age of Onset  . Colon cancer Mother   . Uterine cancer Sister   . Uterine cancer Daughter       Marti Sleigh, MD 09/24/2016, 9:55 AM

## 2016-09-24 NOTE — Patient Instructions (Signed)
Plan to follow up with Dr. Fermin Schwab in one year or sooner if needed.  Please call us in July or August to schedule your appointment for October 2018.

## 2016-11-08 DIAGNOSIS — Z23 Encounter for immunization: Secondary | ICD-10-CM | POA: Diagnosis not present

## 2016-12-06 DIAGNOSIS — J029 Acute pharyngitis, unspecified: Secondary | ICD-10-CM | POA: Diagnosis not present

## 2017-01-19 DIAGNOSIS — Z1231 Encounter for screening mammogram for malignant neoplasm of breast: Secondary | ICD-10-CM | POA: Diagnosis not present

## 2017-03-11 DIAGNOSIS — N3091 Cystitis, unspecified with hematuria: Secondary | ICD-10-CM | POA: Diagnosis not present

## 2017-03-11 DIAGNOSIS — Z6829 Body mass index (BMI) 29.0-29.9, adult: Secondary | ICD-10-CM | POA: Diagnosis not present

## 2017-03-14 DIAGNOSIS — R928 Other abnormal and inconclusive findings on diagnostic imaging of breast: Secondary | ICD-10-CM | POA: Diagnosis not present

## 2017-03-14 DIAGNOSIS — Z853 Personal history of malignant neoplasm of breast: Secondary | ICD-10-CM | POA: Diagnosis not present

## 2017-03-14 DIAGNOSIS — Z8544 Personal history of malignant neoplasm of other female genital organs: Secondary | ICD-10-CM | POA: Diagnosis not present

## 2017-03-14 DIAGNOSIS — D649 Anemia, unspecified: Secondary | ICD-10-CM | POA: Diagnosis not present

## 2017-03-16 DIAGNOSIS — Z853 Personal history of malignant neoplasm of breast: Secondary | ICD-10-CM | POA: Diagnosis not present

## 2017-03-16 DIAGNOSIS — Z8544 Personal history of malignant neoplasm of other female genital organs: Secondary | ICD-10-CM | POA: Diagnosis not present

## 2017-06-24 ENCOUNTER — Telehealth: Payer: Self-pay | Admitting: *Deleted

## 2017-06-24 NOTE — Telephone Encounter (Signed)
Patient called and scheduled a follow up.Plus per the patient request I have changed her phone pphone number and her PCP

## 2017-08-15 DIAGNOSIS — Z7984 Long term (current) use of oral hypoglycemic drugs: Secondary | ICD-10-CM | POA: Diagnosis not present

## 2017-08-15 DIAGNOSIS — Z23 Encounter for immunization: Secondary | ICD-10-CM | POA: Diagnosis not present

## 2017-08-15 DIAGNOSIS — E785 Hyperlipidemia, unspecified: Secondary | ICD-10-CM | POA: Diagnosis not present

## 2017-08-15 DIAGNOSIS — E1142 Type 2 diabetes mellitus with diabetic polyneuropathy: Secondary | ICD-10-CM | POA: Diagnosis not present

## 2017-08-15 DIAGNOSIS — G629 Polyneuropathy, unspecified: Secondary | ICD-10-CM | POA: Diagnosis not present

## 2017-08-15 DIAGNOSIS — E1169 Type 2 diabetes mellitus with other specified complication: Secondary | ICD-10-CM | POA: Diagnosis not present

## 2017-08-15 DIAGNOSIS — Z0000 Encounter for general adult medical examination without abnormal findings: Secondary | ICD-10-CM | POA: Diagnosis not present

## 2017-08-15 DIAGNOSIS — Z79899 Other long term (current) drug therapy: Secondary | ICD-10-CM | POA: Diagnosis not present

## 2017-08-15 DIAGNOSIS — E7800 Pure hypercholesterolemia, unspecified: Secondary | ICD-10-CM | POA: Diagnosis not present

## 2017-09-12 ENCOUNTER — Ambulatory Visit: Payer: Commercial Managed Care - HMO | Admitting: Gynecology

## 2017-09-15 DIAGNOSIS — Z8544 Personal history of malignant neoplasm of other female genital organs: Secondary | ICD-10-CM | POA: Diagnosis not present

## 2017-09-15 DIAGNOSIS — Z853 Personal history of malignant neoplasm of breast: Secondary | ICD-10-CM | POA: Diagnosis not present

## 2017-09-15 DIAGNOSIS — R928 Other abnormal and inconclusive findings on diagnostic imaging of breast: Secondary | ICD-10-CM | POA: Diagnosis not present

## 2017-09-27 DIAGNOSIS — S01111A Laceration without foreign body of right eyelid and periocular area, initial encounter: Secondary | ICD-10-CM | POA: Diagnosis not present

## 2017-09-27 DIAGNOSIS — S199XXA Unspecified injury of neck, initial encounter: Secondary | ICD-10-CM | POA: Diagnosis not present

## 2017-09-27 DIAGNOSIS — S0181XA Laceration without foreign body of other part of head, initial encounter: Secondary | ICD-10-CM | POA: Diagnosis not present

## 2017-09-27 DIAGNOSIS — S0990XA Unspecified injury of head, initial encounter: Secondary | ICD-10-CM | POA: Diagnosis not present

## 2017-09-28 DIAGNOSIS — S0990XA Unspecified injury of head, initial encounter: Secondary | ICD-10-CM | POA: Diagnosis not present

## 2017-09-28 DIAGNOSIS — S199XXA Unspecified injury of neck, initial encounter: Secondary | ICD-10-CM | POA: Diagnosis not present

## 2017-10-07 ENCOUNTER — Encounter: Payer: Self-pay | Admitting: Gynecology

## 2017-10-07 ENCOUNTER — Ambulatory Visit: Payer: Commercial Managed Care - HMO | Attending: Gynecology | Admitting: Gynecology

## 2017-10-07 VITALS — BP 163/68 | HR 78 | Temp 98.1°F | Resp 20 | Ht 65.0 in | Wt 182.0 lb

## 2017-10-07 DIAGNOSIS — Z08 Encounter for follow-up examination after completed treatment for malignant neoplasm: Secondary | ICD-10-CM | POA: Diagnosis not present

## 2017-10-07 DIAGNOSIS — C5700 Malignant neoplasm of unspecified fallopian tube: Secondary | ICD-10-CM

## 2017-10-07 DIAGNOSIS — Z853 Personal history of malignant neoplasm of breast: Secondary | ICD-10-CM | POA: Diagnosis not present

## 2017-10-07 DIAGNOSIS — K219 Gastro-esophageal reflux disease without esophagitis: Secondary | ICD-10-CM | POA: Insufficient documentation

## 2017-10-07 DIAGNOSIS — Z87891 Personal history of nicotine dependence: Secondary | ICD-10-CM | POA: Diagnosis not present

## 2017-10-07 DIAGNOSIS — M1990 Unspecified osteoarthritis, unspecified site: Secondary | ICD-10-CM | POA: Diagnosis not present

## 2017-10-07 DIAGNOSIS — R188 Other ascites: Secondary | ICD-10-CM | POA: Insufficient documentation

## 2017-10-07 DIAGNOSIS — Z79899 Other long term (current) drug therapy: Secondary | ICD-10-CM | POA: Diagnosis not present

## 2017-10-07 DIAGNOSIS — E7800 Pure hypercholesterolemia, unspecified: Secondary | ICD-10-CM | POA: Diagnosis not present

## 2017-10-07 DIAGNOSIS — J910 Malignant pleural effusion: Secondary | ICD-10-CM | POA: Diagnosis not present

## 2017-10-07 DIAGNOSIS — Z8544 Personal history of malignant neoplasm of other female genital organs: Secondary | ICD-10-CM | POA: Diagnosis not present

## 2017-10-07 DIAGNOSIS — I10 Essential (primary) hypertension: Secondary | ICD-10-CM | POA: Diagnosis not present

## 2017-10-07 NOTE — Patient Instructions (Signed)
Plan on following up in one year or sooner if needed.  Please call closer to the date to schedule.

## 2017-10-07 NOTE — Progress Notes (Signed)
Consult Note: Gyn-Onc   [name] [name] 66 y.o. 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 Clinically free of disease.  Plan  patient will see Dr. Hinton Rao in 6 months return to see me in one year. We will continue to monitor CA-125 values at those visits. Signs and symptoms are recurrent ovarian cancer were reviewed and the patient should contact us if she has any difficulties.  Interval History: The patient returns today for continuing followup of stage IIIc fallopian tube cancer. She saw Dr. Hinton Rao 6 months ago. Since then she's done well she denies any GI or GU symptoms she denies any pelvic symptoms. CA-125 value on 09/15/2017 was 5.5  units per mL. She is up-to-date with mammograms and is not due for colonoscopy for a couple more years.   HPI:66 year old white female initially seen in consultation request of Dr. Hosie Poisson of [place] regarding management of new onset of ascites, malignant pleural effusion, and an omental cake. Patient reported that shortly after Memorial Day 2013 she developed rapid onset of abdominal distention. A CT scan was been obtained showing pleural effusion, significant abdominal ascites, an omental cake and carcinomatosis. A liter of pleural effusion was removed as well as a liter and a half from the abdomen. Cytology showed malignant cells consistent with adenocarcinoma. CA 125 value was 656 units per mL CA 27.29 was 208 units per mL CEA was less than 0.3.   On 06/06/2012 the patient underwent exploratory laparotomy. She was found to have diffuse metastatic high-grade serous carcinoma arising from the fallopian tube. At the time of surgery she had 6.5 L of ascites, the omentum was completely replaced by metastatic disease and diaphragms had metastatic disease on both surfaces. There was also considerable amount of disease in the pelvic peritoneum and bladder flap, precluding abdominal hysterectomy.. There is also  considerable amount of metastatic disease around the appendix. Appendectomy, total omentectomy, and left salpingo-oophorectomy were performed. The patient was suboptimally debulked.[name]  Postoperatively the patient received 6 cycles of carboplatin and Taxol chemotherapy. At the completion of chemotherapy her CA 125 level was 10.6 units per mL and a followup CT scan showed no evidence of disease in the abdomen or pelvis and only residual small right pleural effusion (November 2013).   Review of Systems:10 point review of systems is negative as noted above.   Vitals: Blood pressure (!) 163/68, pulse 78, temperature 98.1 F (36.7 C), temperature source Oral, resp. rate 20, height 5\' 5"  (1.651 m), weight 182 lb (82.6 kg), SpO2 98 %.  Physical Exam: General : The patient is a healthy woman in no acute distress.  HEENT: normocephalic, extraoccular movements normal; neck is supple without thyromegally  Lynphnodes: Supraclavicular and inguinal nodes not enlarged  Abdomen: Soft, non-tender, no ascites, no organomegally, no masses, no hernias  Pelvic:  EGBUS: Normal female  Vagina: Normal mucosa. Urethra and Bladder: Normal, non-tender  Cervix: Normal Uterus: Normal (is recalled that we are unable to do a hysterectomy given the extent of pelvic disease) Bi-manual examination: No masses or nodularity. Rectal: normal sphincter tone, no masses, no stool, no blood  Lower extremities: No edema or varicosities. Normal range of motion     No Known Allergies  Past Medical History:  Diagnosis Date  . Anemia   . Arthritis    arthritis in back   . Ascites   . Breast cancer ([place])   . Collapsed vertebra ([place])    thoracic to lumbar vertebra   .  GERD (gastroesophageal reflux disease)   . H/O hiatal hernia   . Hypercholesteremia   . Hypertension   . Lymph edema    left arm   . Shortness of breath    some due to ascites     Past Surgical History:  Procedure Laterality Date  . APPENDECTOMY   06/06/2012   Procedure: APPENDECTOMY;  Surgeon: Alvino Chapel, MD;  Location: [facility];  Service: Gynecology;;  . EYE SURGERY     as a child   . LAPAROTOMY  06/06/2012   Procedure: EXPLORATORY LAPAROTOMY;  Surgeon: Alvino Chapel, MD;  Location: [facility];  Service: Gynecology;  Laterality: N/A;  . MASTECTOMY, PARTIAL  Jan 2005   Left  . SALPINGOOPHORECTOMY  06/06/2012   Procedure: SALPINGO OOPHERECTOMY;  Surgeon: Alvino Chapel, MD;  Location: [facility];  Service: Gynecology;  Laterality: Left;  . UTERINE FIBROID SURGERY  2003    Current Outpatient Prescriptions  Medication Sig Dispense Refill  . cetirizine (ZYRTEC) 10 MG tablet Take 10 mg by mouth daily.    [name] glimepiride (AMARYL) 2 MG tablet Take 1 mg by mouth daily with breakfast.     . losartan (COZAAR) 100 MG tablet Take 100 mg by mouth daily with breakfast.    . metFORMIN (GLUCOPHAGE-XR) 500 MG 24 hr tablet     . metoprolol succinate (TOPROL-XL) 100 MG 24 hr tablet     . ranitidine (ZANTAC) 150 MG tablet Take 150 mg by mouth daily as needed.     [name] RELION CONFIRM/MICRO TEST test strip     . simvastatin (ZOCOR) 40 MG tablet Take 40 mg by mouth every evening.    . vitamin B-12 (CYANOCOBALAMIN) 500 MCG tablet Take 500 mcg by mouth daily.     No current facility-administered medications for this visit.     Social History   Social History  . Marital status: Married    Spouse name: N/A  . Number of children: N/A  . Years of education: N/A   Occupational History  . Not on file.   Social History Main Topics  . Smoking status: Former Smoker    Quit date: 12/06/2002  . Smokeless tobacco: Never Used  . Alcohol use No  . Drug use: No  . Sexual activity: Not Currently   Other Topics Concern  . Not on file   Social History Narrative  . No narrative on file    Family History  Problem Relation Age of Onset  . Colon cancer Mother   . Uterine cancer Sister   . Uterine cancer Daughter       Marti Sleigh, MD 10/07/2017, 12:44 PM

## 2017-12-14 DIAGNOSIS — G629 Polyneuropathy, unspecified: Secondary | ICD-10-CM | POA: Diagnosis not present

## 2017-12-14 DIAGNOSIS — Z6829 Body mass index (BMI) 29.0-29.9, adult: Secondary | ICD-10-CM | POA: Diagnosis not present

## 2017-12-14 DIAGNOSIS — E1169 Type 2 diabetes mellitus with other specified complication: Secondary | ICD-10-CM | POA: Diagnosis not present

## 2017-12-14 DIAGNOSIS — E785 Hyperlipidemia, unspecified: Secondary | ICD-10-CM | POA: Diagnosis not present

## 2017-12-14 DIAGNOSIS — E1142 Type 2 diabetes mellitus with diabetic polyneuropathy: Secondary | ICD-10-CM | POA: Diagnosis not present

## 2017-12-14 DIAGNOSIS — I10 Essential (primary) hypertension: Secondary | ICD-10-CM | POA: Diagnosis not present

## 2017-12-14 DIAGNOSIS — Z23 Encounter for immunization: Secondary | ICD-10-CM | POA: Diagnosis not present

## 2017-12-14 DIAGNOSIS — E663 Overweight: Secondary | ICD-10-CM | POA: Diagnosis not present

## 2017-12-14 DIAGNOSIS — E7800 Pure hypercholesterolemia, unspecified: Secondary | ICD-10-CM | POA: Diagnosis not present

## 2018-01-24 DIAGNOSIS — Z1231 Encounter for screening mammogram for malignant neoplasm of breast: Secondary | ICD-10-CM | POA: Diagnosis not present

## 2018-02-09 DIAGNOSIS — E042 Nontoxic multinodular goiter: Secondary | ICD-10-CM | POA: Diagnosis not present

## 2018-02-09 DIAGNOSIS — E041 Nontoxic single thyroid nodule: Secondary | ICD-10-CM | POA: Diagnosis not present

## 2018-03-14 DIAGNOSIS — R928 Other abnormal and inconclusive findings on diagnostic imaging of breast: Secondary | ICD-10-CM | POA: Diagnosis not present

## 2018-03-14 DIAGNOSIS — C5702 Malignant neoplasm of left fallopian tube: Secondary | ICD-10-CM | POA: Diagnosis not present

## 2018-03-14 DIAGNOSIS — Z853 Personal history of malignant neoplasm of breast: Secondary | ICD-10-CM | POA: Diagnosis not present

## 2018-03-14 DIAGNOSIS — D649 Anemia, unspecified: Secondary | ICD-10-CM | POA: Diagnosis not present

## 2018-03-16 DIAGNOSIS — Z8544 Personal history of malignant neoplasm of other female genital organs: Secondary | ICD-10-CM | POA: Diagnosis not present

## 2018-03-16 DIAGNOSIS — Z853 Personal history of malignant neoplasm of breast: Secondary | ICD-10-CM | POA: Diagnosis not present

## 2018-03-16 DIAGNOSIS — E079 Disorder of thyroid, unspecified: Secondary | ICD-10-CM | POA: Diagnosis not present

## 2018-05-23 ENCOUNTER — Telehealth: Payer: Self-pay

## 2018-05-23 NOTE — Telephone Encounter (Signed)
Incoming call from pt- she wanted to make an appt for October-done - Oct 8 at 9 am- pt agreeable - no other needs per pt at this time.

## 2018-08-31 DIAGNOSIS — I10 Essential (primary) hypertension: Secondary | ICD-10-CM | POA: Diagnosis not present

## 2018-08-31 DIAGNOSIS — H524 Presbyopia: Secondary | ICD-10-CM | POA: Diagnosis not present

## 2018-08-31 DIAGNOSIS — Z0100 Encounter for examination of eyes and vision without abnormal findings: Secondary | ICD-10-CM | POA: Diagnosis not present

## 2018-08-31 DIAGNOSIS — E119 Type 2 diabetes mellitus without complications: Secondary | ICD-10-CM | POA: Diagnosis not present

## 2018-08-31 DIAGNOSIS — Z7984 Long term (current) use of oral hypoglycemic drugs: Secondary | ICD-10-CM | POA: Diagnosis not present

## 2018-08-31 DIAGNOSIS — H5211 Myopia, right eye: Secondary | ICD-10-CM | POA: Diagnosis not present

## 2018-08-31 DIAGNOSIS — H52223 Regular astigmatism, bilateral: Secondary | ICD-10-CM | POA: Diagnosis not present

## 2018-09-01 DIAGNOSIS — Z0000 Encounter for general adult medical examination without abnormal findings: Secondary | ICD-10-CM | POA: Diagnosis not present

## 2018-09-01 DIAGNOSIS — E1142 Type 2 diabetes mellitus with diabetic polyneuropathy: Secondary | ICD-10-CM | POA: Diagnosis not present

## 2018-09-01 DIAGNOSIS — E7800 Pure hypercholesterolemia, unspecified: Secondary | ICD-10-CM | POA: Diagnosis not present

## 2018-09-01 DIAGNOSIS — I10 Essential (primary) hypertension: Secondary | ICD-10-CM | POA: Diagnosis not present

## 2018-09-01 DIAGNOSIS — E1169 Type 2 diabetes mellitus with other specified complication: Secondary | ICD-10-CM | POA: Diagnosis not present

## 2018-09-01 DIAGNOSIS — G629 Polyneuropathy, unspecified: Secondary | ICD-10-CM | POA: Diagnosis not present

## 2018-09-01 DIAGNOSIS — E785 Hyperlipidemia, unspecified: Secondary | ICD-10-CM | POA: Diagnosis not present

## 2018-09-01 DIAGNOSIS — E041 Nontoxic single thyroid nodule: Secondary | ICD-10-CM | POA: Diagnosis not present

## 2018-09-01 DIAGNOSIS — Z79899 Other long term (current) drug therapy: Secondary | ICD-10-CM | POA: Diagnosis not present

## 2018-09-04 ENCOUNTER — Telehealth: Payer: Self-pay | Admitting: *Deleted

## 2018-09-04 ENCOUNTER — Encounter: Payer: Self-pay | Admitting: *Deleted

## 2018-09-04 NOTE — Telephone Encounter (Signed)
Attempted to call the patient's number again, unable to reach. Letter sent.

## 2018-09-04 NOTE — Telephone Encounter (Signed)
Attempted to reach the patient both at her number and her husband's number.  Unable to reach both and unable to leave a message.

## 2018-09-05 DIAGNOSIS — Z853 Personal history of malignant neoplasm of breast: Secondary | ICD-10-CM | POA: Diagnosis not present

## 2018-09-05 DIAGNOSIS — R978 Other abnormal tumor markers: Secondary | ICD-10-CM | POA: Diagnosis not present

## 2018-09-11 ENCOUNTER — Telehealth: Payer: Self-pay

## 2018-09-11 NOTE — Telephone Encounter (Signed)
Incoming call from patient to confirm tomorrow's appt. No other needs per pt at this time.

## 2018-09-12 ENCOUNTER — Inpatient Hospital Stay: Payer: Medicare HMO | Attending: Gynecology | Admitting: Gynecology

## 2018-09-12 ENCOUNTER — Encounter: Payer: Self-pay | Admitting: Gynecology

## 2018-09-12 VITALS — BP 138/94 | HR 50 | Temp 98.1°F | Resp 20 | Ht 65.75 in | Wt 180.5 lb

## 2018-09-12 DIAGNOSIS — Z87891 Personal history of nicotine dependence: Secondary | ICD-10-CM | POA: Diagnosis not present

## 2018-09-12 DIAGNOSIS — Z90722 Acquired absence of ovaries, bilateral: Secondary | ICD-10-CM | POA: Insufficient documentation

## 2018-09-12 DIAGNOSIS — C5700 Malignant neoplasm of unspecified fallopian tube: Secondary | ICD-10-CM

## 2018-09-12 DIAGNOSIS — Z8544 Personal history of malignant neoplasm of other female genital organs: Secondary | ICD-10-CM | POA: Insufficient documentation

## 2018-09-12 DIAGNOSIS — Z90710 Acquired absence of both cervix and uterus: Secondary | ICD-10-CM | POA: Insufficient documentation

## 2018-09-12 DIAGNOSIS — Z9221 Personal history of antineoplastic chemotherapy: Secondary | ICD-10-CM | POA: Insufficient documentation

## 2018-09-12 DIAGNOSIS — Z08 Encounter for follow-up examination after completed treatment for malignant neoplasm: Secondary | ICD-10-CM | POA: Insufficient documentation

## 2018-09-12 NOTE — Patient Instructions (Signed)
Plan to follow up with Dr. Hinton Rao in six months (April 2020) and Dr. Fermin Schwab in October 2020.  Please call our office at [phone] in June or July 2020 to schedule for October 2020. Please call for any needs, concerns, or new symptoms such as persistent abdominal bloating.

## 2018-09-12 NOTE — Progress Notes (Signed)
Consult Note: Gyn-Onc   [name] [name] 67 y.o. 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 Clinically free of disease.  Plan  patient will see Dr. Hinton Rao in 6 months return to see me in one year. We will continue to monitor CA-125 values at those visits. Signs and symptoms are recurrent ovarian cancer were reviewed and the patient should contact us if she has any difficulties.  Interval History: The patient returns today for continuing followup of stage IIIc fallopian tube cancer. She saw Dr. Hinton Rao 6 months ago. Since then she's done well she denies any GI or GU symptoms she denies any pelvic symptoms. CA-125 value on September 06, 2018 was 5.2  units per mL. She is up-to-date with mammograms and is not due for colonoscopy for a couple more years.   HPI:67 year old white female initially seen in consultation request of Dr. Hosie Poisson of [place] regarding management of new onset of ascites, malignant pleural effusion, and an omental cake. Patient reported that shortly after Memorial Day 2013 she developed rapid onset of abdominal distention. A CT scan was been obtained showing pleural effusion, significant abdominal ascites, an omental cake and carcinomatosis. A liter of pleural effusion was removed as well as a liter and a half from the abdomen. Cytology showed malignant cells consistent with adenocarcinoma. CA 125 value was 656 units per mL CA 27.29 was 208 units per mL CEA was less than 0.3.   On 06/06/2012 the patient underwent exploratory laparotomy. She was found to have diffuse metastatic high-grade serous carcinoma arising from the fallopian tube. At the time of surgery she had 6.5 L of ascites, the omentum was completely replaced by metastatic disease and diaphragms had metastatic disease on both surfaces. There was also considerable amount of disease in the pelvic peritoneum and bladder flap, precluding abdominal hysterectomy.. There is also  considerable amount of metastatic disease around the appendix. Appendectomy, total omentectomy, and left salpingo-oophorectomy were performed. The patient was suboptimally debulked.[name]  Postoperatively the patient received 6 cycles of carboplatin and Taxol chemotherapy. At the completion of chemotherapy her CA 125 level was 10.6 units per mL and a followup CT scan showed no evidence of disease in the abdomen or pelvis and only residual small right pleural effusion (November 2013).   Review of Systems:10 point review of systems is negative as noted above.   Vitals: Blood pressure (!) 138/94, pulse (!) 50, temperature 98.1 F (36.7 C), temperature source Oral, resp. rate 20, height 5' 5.75" (1.67 m), weight 180 lb 8 oz (81.9 kg), SpO2 100 %.  Physical Exam: General : The patient is a healthy woman in no acute distress.  HEENT: normocephalic, extraoccular movements normal; neck is supple without thyromegally  Lynphnodes: Supraclavicular and inguinal nodes not enlarged  Abdomen: Soft, non-tender, no ascites, no organomegally, no masses, no hernias  Pelvic:  EGBUS: Normal female  Vagina: Normal mucosa. Urethra and Bladder: Normal, non-tender  Cervix: Normal Uterus: Normal (is recalled that we are unable to do a hysterectomy given the extent of pelvic disease) Bi-manual examination: No masses or nodularity. Rectal: normal sphincter tone, no masses, no stool, no blood  Lower extremities: No edema or varicosities. Normal range of motion     No Known Allergies  Past Medical History:  Diagnosis Date  . Anemia   . Arthritis    arthritis in back   . Ascites   . Breast cancer ([place])   . Collapsed vertebra ([place])    thoracic to  lumbar vertebra   . GERD (gastroesophageal reflux disease)   . H/O hiatal hernia   . Hypercholesteremia   . Hypertension   . Lymph edema    left arm   . Shortness of breath    some due to ascites     Past Surgical History:  Procedure Laterality Date  .  APPENDECTOMY  06/06/2012   Procedure: APPENDECTOMY;  Surgeon: Alvino Chapel, MD;  Location: [facility];  Service: Gynecology;;  . EYE SURGERY     as a child   . LAPAROTOMY  06/06/2012   Procedure: EXPLORATORY LAPAROTOMY;  Surgeon: Alvino Chapel, MD;  Location: [facility];  Service: Gynecology;  Laterality: N/A;  . MASTECTOMY, PARTIAL  Jan 2005   Left  . SALPINGOOPHORECTOMY  06/06/2012   Procedure: SALPINGO OOPHERECTOMY;  Surgeon: Alvino Chapel, MD;  Location: [facility];  Service: Gynecology;  Laterality: Left;  . UTERINE FIBROID SURGERY  2003    Current Outpatient Medications  Medication Sig Dispense Refill  . cetirizine (ZYRTEC) 10 MG tablet Take 10 mg by mouth as needed.     [name] glimepiride (AMARYL) 2 MG tablet Take 1 mg by mouth daily with breakfast.     . losartan (COZAAR) 100 MG tablet Take 100 mg by mouth daily with breakfast.    . metFORMIN (GLUCOPHAGE-XR) 500 MG 24 hr tablet Take by mouth daily.     . metoprolol succinate (TOPROL-XL) 100 MG 24 hr tablet     . ranitidine (ZANTAC) 150 MG tablet Take 150 mg by mouth daily as needed.     [name] RELION CONFIRM/MICRO TEST test strip     . simvastatin (ZOCOR) 40 MG tablet Take 40 mg by mouth every evening.    . vitamin B-12 (CYANOCOBALAMIN) 500 MCG tablet Take 500 mcg by mouth daily.     No current facility-administered medications for this visit.     Social History   Socioeconomic History  . Marital status: Married    Spouse name: Not on file  . Number of children: Not on file  . Years of education: Not on file  . Highest education level: Not on file  Occupational History  . Not on file  Social Needs  . Financial resource strain: Not on file  . Food insecurity:    Worry: Not on file    Inability: Not on file  . Transportation needs:    Medical: Not on file    Non-medical: Not on file  Tobacco Use  . Smoking status: Former Smoker    Last attempt to quit: 12/06/2002    Years since quitting: 15.7  . Smokeless  tobacco: Never Used  Substance and Sexual Activity  . Alcohol use: No  . Drug use: No  . Sexual activity: Not Currently  Lifestyle  . Physical activity:    Days per week: Not on file    Minutes per session: Not on file  . Stress: Not on file  Relationships  . Social connections:    Talks on phone: Not on file    Gets together: Not on file    Attends religious service: Not on file    Active member of club or organization: Not on file    Attends meetings of clubs or organizations: Not on file    Relationship status: Not on file  . Intimate partner violence:    Fear of current or ex partner: Not on file    Emotionally abused: Not on file    Physically abused:  Not on file    Forced sexual activity: Not on file  Other Topics Concern  . Not on file  Social History Narrative  . Not on file    Family History  Problem Relation Age of Onset  . Colon cancer Mother   . Uterine cancer Sister   . Uterine cancer Daughter       Marti Sleigh, MD 09/12/2018, 10:35 AM

## 2018-09-27 DIAGNOSIS — Z23 Encounter for immunization: Secondary | ICD-10-CM | POA: Diagnosis not present

## 2019-01-29 DIAGNOSIS — Z1231 Encounter for screening mammogram for malignant neoplasm of breast: Secondary | ICD-10-CM | POA: Diagnosis not present

## 2019-03-16 DIAGNOSIS — C772 Secondary and unspecified malignant neoplasm of intra-abdominal lymph nodes: Secondary | ICD-10-CM | POA: Insufficient documentation

## 2019-05-07 DIAGNOSIS — Z8544 Personal history of malignant neoplasm of other female genital organs: Secondary | ICD-10-CM | POA: Diagnosis not present

## 2019-05-07 DIAGNOSIS — Z853 Personal history of malignant neoplasm of breast: Secondary | ICD-10-CM | POA: Diagnosis not present

## 2019-05-07 DIAGNOSIS — R928 Other abnormal and inconclusive findings on diagnostic imaging of breast: Secondary | ICD-10-CM | POA: Diagnosis not present

## 2019-05-07 DIAGNOSIS — R69 Illness, unspecified: Secondary | ICD-10-CM | POA: Diagnosis not present

## 2019-05-08 DIAGNOSIS — Z853 Personal history of malignant neoplasm of breast: Secondary | ICD-10-CM | POA: Diagnosis not present

## 2019-05-08 DIAGNOSIS — R69 Illness, unspecified: Secondary | ICD-10-CM | POA: Diagnosis not present

## 2019-05-08 DIAGNOSIS — Z0001 Encounter for general adult medical examination with abnormal findings: Secondary | ICD-10-CM | POA: Diagnosis not present

## 2019-05-08 DIAGNOSIS — R971 Elevated cancer antigen 125 [CA 125]: Secondary | ICD-10-CM | POA: Diagnosis not present

## 2019-05-08 DIAGNOSIS — Z8544 Personal history of malignant neoplasm of other female genital organs: Secondary | ICD-10-CM | POA: Diagnosis not present

## 2019-05-10 ENCOUNTER — Ambulatory Visit (INDEPENDENT_AMBULATORY_CARE_PROVIDER_SITE_OTHER): Payer: Medicare HMO | Admitting: Cardiology

## 2019-05-10 ENCOUNTER — Encounter: Payer: Self-pay | Admitting: Cardiology

## 2019-05-10 ENCOUNTER — Other Ambulatory Visit: Payer: Self-pay

## 2019-05-10 VITALS — BP 160/62 | HR 80 | Ht 65.75 in | Wt 180.4 lb

## 2019-05-10 DIAGNOSIS — C5700 Malignant neoplasm of unspecified fallopian tube: Secondary | ICD-10-CM

## 2019-05-10 DIAGNOSIS — I493 Ventricular premature depolarization: Secondary | ICD-10-CM | POA: Diagnosis not present

## 2019-05-10 DIAGNOSIS — I10 Essential (primary) hypertension: Secondary | ICD-10-CM | POA: Insufficient documentation

## 2019-05-10 DIAGNOSIS — I447 Left bundle-branch block, unspecified: Secondary | ICD-10-CM | POA: Insufficient documentation

## 2019-05-10 DIAGNOSIS — Z1501 Genetic susceptibility to malignant neoplasm of breast: Secondary | ICD-10-CM | POA: Diagnosis not present

## 2019-05-10 NOTE — Patient Instructions (Addendum)
Medication Instructions:  Your physician recommends that you continue on your current medications as directed. Please refer to the Current Medication list given to you today.  If you need a refill on your cardiac medications before your next appointment, please call your pharmacy.   Lab work: None.  If you have labs (blood work) drawn today and your tests are completely normal, you will receive your results only by:  [place] (if you have MyChart) OR  A paper copy in the mail If you have any lab test that is abnormal or we need to change your treatment, we will call you to review the results.  Testing/Procedures: Your physician has recommended that you wear a holter monitor. Holter monitors are medical devices that record the hearts electrical activity. Doctors most often use these monitors to diagnose arrhythmias. Arrhythmias are problems with the speed or rhythm of the heartbeat. The monitor is a small, portable device. You can wear one while you do your normal daily activities. This is usually used to diagnose what is causing palpitations/syncope (passing out). Wear for7 days.   Your physician has requested that you have an echocardiogram. Echocardiography is a painless test that uses sound waves to create images of your heart. It provides your doctor with information about the size and shape of your heart and how well your hearts chambers and valves are working. This procedure takes approximately one hour. There are no restrictions for this procedure.       Follow-Up: At [facility], you and your health needs are our priority.  As part of our continuing mission to provide you with exceptional heart care, we have created designated Provider Care Teams.  These Care Teams include your primary Cardiologist (physician) and Advanced Practice Providers (APPs -  Physician Assistants and Nurse Practitioners) who all work together to provide you with the care you need, when you need  it. You will need a follow up appointment in 6 months.  Please call our office 2 months in advance to schedule this appointment.  You may see No primary care provider on file. or another member of our [employer] Provider Team in [place]: Shirlee More, MD  Jyl Heinz, MD  Any Other Special Instructions Will Be Listed Below (If Applicable).   Echocardiogram An echocardiogram is a procedure that uses painless sound waves (ultrasound) to produce an image of the heart. Images from an echocardiogram can provide important information about:  Signs of coronary artery disease (CAD).  Aneurysm detection. An aneurysm is a weak or damaged part of an artery wall that bulges out from the normal force of blood pumping through the body.  Heart size and shape. Changes in the size or shape of the heart can be associated with certain conditions, including heart failure, aneurysm, and CAD.  Heart muscle function.  Heart valve function.  Signs of a past heart attack.  Fluid buildup around the heart.  Thickening of the heart muscle.  A tumor or infectious growth around the heart valves. Tell a health care provider about:  Any allergies you have.  All medicines you are taking, including vitamins, herbs, eye drops, creams, and over-the-counter medicines.  Any blood disorders you have.  Any surgeries you have had.  Any medical conditions you have.  Whether you are pregnant or may be pregnant. What are the risks? Generally, this is a safe procedure. However, problems may occur, including:  Allergic reaction to dye (contrast) that may be used during the procedure. What happens before  the procedure? No specific preparation is needed. You may eat and drink normally. What happens during the procedure?   An IV tube may be inserted into one of your veins.  You may receive contrast through this tube. A contrast is an injection that improves the quality of the pictures from your  heart.  A gel will be applied to your chest.  A wand-like tool (transducer) will be moved over your chest. The gel will help to transmit the sound waves from the transducer.  The sound waves will harmlessly bounce off of your heart to allow the heart images to be captured in real-time motion. The images will be recorded on a computer. The procedure may vary among health care providers and hospitals. What happens after the procedure?  You may return to your normal, everyday life, including diet, activities, and medicines, unless your health care provider tells you not to do that. Summary  An echocardiogram is a procedure that uses painless sound waves (ultrasound) to produce an image of the heart.  Images from an echocardiogram can provide important information about the size and shape of your heart, heart muscle function, heart valve function, and fluid buildup around your heart.  You do not need to do anything to prepare before this procedure. You may eat and drink normally.  After the echocardiogram is completed, you may return to your normal, everyday life, unless your health care provider tells you not to do that. This information is not intended to replace advice given to you by your health care provider. Make sure you discuss any questions you have with your health care provider. Document Released: 11/19/2000 Document Revised: 12/25/2016 Document Reviewed: 12/25/2016 Elsevier Interactive Patient Education  2019 [employer].

## 2019-05-10 NOTE — Progress Notes (Signed)
Cardiology Consultation:    Date:  05/10/2019   ID:  [name] [name], DOB 13-Feb-1951, MRN [id number]  PCP:  Street, Sharon Mt, MD  Cardiologist:  Jenne Campus, MD   Referring MD: Street, Sharon Mt, *   Chief Complaint  Patient presents with  . Abnormal ECG  Him doing well  History of Present Illness:    [name] [name] is a 68 y.o. female who is being seen today for the evaluation of palpitations left bundle branch block at the request of [address] Sharon Mt, *.  Her past medical history is very complex she is a 68 years old with stage I hormone receptor positive left breast cancer in 2005 at age of 21 that was treated with lumpectomy axillary dissection she did receive chemotherapy as well as radiation she also got history of fallopian tube cancer diagnosed in July 2013 treated aggressively by oncology team.  She went yesterday for regular visit she was find to have irregular heart rate EKG was done which showed left bundle branch block and frequent ventricular ectopy she is completely unaware of it.  She does not have any shortness of breath there is no chest pain no tightness no squeezing no pressure no burning in her chest she does not have any dizziness or passing out she is very active her husband who is with her in the room tell me that he cannot keep up with her.  She never had any heart trouble.  She does not remember having EKG done she used to smoke but quit 10 years ago She does have history of hypertension as well as diabetes.  I do not know what her cholesterol status is.  There is no family history of sudden cardiac death or premature cardiac vascular problem.  Past Medical History:  Diagnosis Date  . Anemia   . Arthritis    arthritis in back   . Ascites   . Breast cancer ([place])   . Collapsed vertebra ([place])    thoracic to lumbar vertebra   . Diabetes mellitus without complication ([place])   . GERD (gastroesophageal reflux disease)   . H/O hiatal hernia   .  Hypercholesteremia   . Hypertension   . Lymph edema    left arm   . Shortness of breath    some due to ascites     Past Surgical History:  Procedure Laterality Date  . APPENDECTOMY  06/06/2012   Procedure: APPENDECTOMY;  Surgeon: Alvino Chapel, MD;  Location: [facility];  Service: Gynecology;;  . EYE SURGERY     as a child   . LAPAROTOMY  06/06/2012   Procedure: EXPLORATORY LAPAROTOMY;  Surgeon: Alvino Chapel, MD;  Location: [facility];  Service: Gynecology;  Laterality: N/A;  . MASTECTOMY, PARTIAL  Jan 2005   Left  . SALPINGOOPHORECTOMY  06/06/2012   Procedure: SALPINGO OOPHERECTOMY;  Surgeon: Alvino Chapel, MD;  Location: [facility];  Service: Gynecology;  Laterality: Left;  . UTERINE FIBROID SURGERY  2003    Current Medications: Current Meds  Medication Sig  . cetirizine (ZYRTEC) 10 MG tablet Take 10 mg by mouth as needed.   [name] glimepiride (AMARYL) 1 MG tablet Take 1 mg by mouth daily with breakfast.   . losartan (COZAAR) 100 MG tablet Take 100 mg by mouth daily with breakfast.  . metFORMIN (GLUCOPHAGE-XR) 500 MG 24 hr tablet Take by mouth daily.   . metoprolol succinate (TOPROL-XL) 100 MG 24 hr tablet   . Multiple Vitamins-Minerals (  CENTRUM SILVER ADULT 50+ PO) Take 1 tablet by mouth daily.  [name] RELION CONFIRM/MICRO TEST test strip   . simvastatin (ZOCOR) 40 MG tablet Take 40 mg by mouth every evening.  . vitamin B-12 (CYANOCOBALAMIN) 500 MCG tablet Take 500 mcg by mouth daily.     Allergies:   Patient has no known allergies.   Social History   Socioeconomic History  . Marital status: Married    Spouse name: Not on file  . Number of children: Not on file  . Years of education: Not on file  . Highest education level: Not on file  Occupational History  . Not on file  Social Needs  . Financial resource strain: Not on file  . Food insecurity:    Worry: Not on file    Inability: Not on file  . Transportation needs:    Medical: Not on file     Non-medical: Not on file  Tobacco Use  . Smoking status: Former Smoker    Last attempt to quit: 12/06/2002    Years since quitting: 16.4  . Smokeless tobacco: Never Used  Substance and Sexual Activity  . Alcohol use: No  . Drug use: No  . Sexual activity: Not Currently  Lifestyle  . Physical activity:    Days per week: Not on file    Minutes per session: Not on file  . Stress: Not on file  Relationships  . Social connections:    Talks on phone: Not on file    Gets together: Not on file    Attends religious service: Not on file    Active member of club or organization: Not on file    Attends meetings of clubs or organizations: Not on file    Relationship status: Not on file  Other Topics Concern  . Not on file  Social History Narrative  . Not on file     Family History: The patient's family history includes Colon cancer in her mother; Liver cancer in her paternal grandfather; Uterine cancer in her daughter and sister. ROS:   Please see the history of present illness.    All 14 point review of systems negative except as described per history of present illness.  EKGs/Labs/Other Studies Reviewed:    The following studies were reviewed today: EKG showed normal sinus rhythm PVCs left bundle branch block PVC look like coming from the right ventricle   Recent Labs: No results found for requested labs within last 8760 hours.  Recent Lipid Panel No results found for: CHOL, TRIG, HDL, CHOLHDL, VLDL, LDLCALC, LDLDIRECT  Physical Exam:    VS:  BP (!) 160/62   Pulse 80   Ht 5' 5.75" (1.67 m)   Wt 180 lb 6.4 oz (81.8 kg)   SpO2 97%   BMI 29.34 kg/m     Wt Readings from Last 3 Encounters:  05/10/19 180 lb 6.4 oz (81.8 kg)  09/12/18 180 lb 8 oz (81.9 kg)  10/07/17 182 lb (82.6 kg)     GEN:  Well nourished, well developed in no acute distress HEENT: Normal NECK: No JVD; No carotid bruits LYMPHATICS: No lymphadenopathy CARDIAC: RRR, no murmurs, no rubs, no gallops  RESPIRATORY:  Clear to auscultation without rales, wheezing or rhonchi  ABDOMEN: Soft, non-tender, non-distended MUSCULOSKELETAL:  No edema; No deformity  SKIN: Warm and dry NEUROLOGIC:  Alert and oriented x 3 PSYCHIATRIC:  Normal affect   ASSESSMENT:    1. Left bundle branch block   2. Ventricular ectopy  PLAN:    In order of problems listed above:  1. Left bundle branch block.  Incidental discovery as a part of her work-up will schedule her to have echocardiogram to assess left ventricular ejection fraction.  She may require stress test in the future to make sure she does not have any inducible ischemia.  She does not have any symptoms suggesting it but she does have multiple risk factor for it. 2. Frequent ventricular ectopy with ventricular bigeminy.  No dizziness no syncope no palpitations.  I will ask her to have 7 days monitor to make sure she does not have any more sustained arrhythmia.  Overall we need to determine the burden of this arrhythmia 3. Breast cancer with fallopian tube cancer aggressively managed excellently medical as by oncology team. 4. Essential hypertension appears to be well controlled continue present management. 5. Diabetes mellitus stable.   Medication Adjustments/Labs and Tests Ordered: Current medicines are reviewed at length with the patient today.  Concerns regarding medicines are outlined above.  Orders Placed This Encounter  Procedures  . LONG TERM MONITOR (3-14 DAYS)  . ECHOCARDIOGRAM COMPLETE   No orders of the defined types were placed in this encounter.   Signed, Park Liter, MD, [facility]. 05/10/2019 2:30 PM    [place]

## 2019-05-11 DIAGNOSIS — K449 Diaphragmatic hernia without obstruction or gangrene: Secondary | ICD-10-CM | POA: Diagnosis not present

## 2019-05-11 DIAGNOSIS — C5702 Malignant neoplasm of left fallopian tube: Secondary | ICD-10-CM | POA: Diagnosis not present

## 2019-05-11 DIAGNOSIS — R1909 Other intra-abdominal and pelvic swelling, mass and lump: Secondary | ICD-10-CM | POA: Diagnosis not present

## 2019-05-11 DIAGNOSIS — R971 Elevated cancer antigen 125 [CA 125]: Secondary | ICD-10-CM | POA: Diagnosis not present

## 2019-05-11 DIAGNOSIS — C5700 Malignant neoplasm of unspecified fallopian tube: Secondary | ICD-10-CM | POA: Diagnosis not present

## 2019-05-14 ENCOUNTER — Telehealth: Payer: Self-pay | Admitting: *Deleted

## 2019-05-14 DIAGNOSIS — Z853 Personal history of malignant neoplasm of breast: Secondary | ICD-10-CM | POA: Diagnosis not present

## 2019-05-14 DIAGNOSIS — R971 Elevated cancer antigen 125 [CA 125]: Secondary | ICD-10-CM | POA: Diagnosis not present

## 2019-05-14 DIAGNOSIS — Z8544 Personal history of malignant neoplasm of other female genital organs: Secondary | ICD-10-CM | POA: Diagnosis not present

## 2019-05-14 DIAGNOSIS — R1909 Other intra-abdominal and pelvic swelling, mass and lump: Secondary | ICD-10-CM | POA: Diagnosis not present

## 2019-05-14 NOTE — Telephone Encounter (Signed)
Received fax from [facility] regarding the patient, per [facility] APP patient to see Dr. Skeet Latch tomorrow. Called and spoke with Claiborne Billings APP at [facility], gave her the appt information; she will call the patient.

## 2019-05-15 ENCOUNTER — Inpatient Hospital Stay: Payer: Medicare HMO | Attending: Gynecologic Oncology | Admitting: Gynecologic Oncology

## 2019-05-15 ENCOUNTER — Other Ambulatory Visit (INDEPENDENT_AMBULATORY_CARE_PROVIDER_SITE_OTHER): Payer: Medicare HMO

## 2019-05-15 ENCOUNTER — Other Ambulatory Visit: Payer: Self-pay

## 2019-05-15 VITALS — BP 184/74 | HR 46 | Temp 98.5°F | Resp 18 | Ht 65.75 in | Wt 181.0 lb

## 2019-05-15 DIAGNOSIS — I447 Left bundle-branch block, unspecified: Secondary | ICD-10-CM | POA: Diagnosis not present

## 2019-05-15 DIAGNOSIS — Z1501 Genetic susceptibility to malignant neoplasm of breast: Secondary | ICD-10-CM

## 2019-05-15 DIAGNOSIS — R911 Solitary pulmonary nodule: Secondary | ICD-10-CM | POA: Diagnosis not present

## 2019-05-15 DIAGNOSIS — R9389 Abnormal findings on diagnostic imaging of other specified body structures: Secondary | ICD-10-CM

## 2019-05-15 DIAGNOSIS — I493 Ventricular premature depolarization: Secondary | ICD-10-CM | POA: Diagnosis not present

## 2019-05-15 DIAGNOSIS — C5701 Malignant neoplasm of right fallopian tube: Secondary | ICD-10-CM

## 2019-05-15 DIAGNOSIS — C5700 Malignant neoplasm of unspecified fallopian tube: Secondary | ICD-10-CM

## 2019-05-15 DIAGNOSIS — N840 Polyp of corpus uteri: Secondary | ICD-10-CM | POA: Diagnosis not present

## 2019-05-15 DIAGNOSIS — Z90722 Acquired absence of ovaries, bilateral: Secondary | ICD-10-CM | POA: Diagnosis not present

## 2019-05-15 NOTE — Patient Instructions (Addendum)
You had a biopsy of the lining of your uterus today.  Dr. Skeet Latch will call you when the results are ready and to discuss results.  Please call the office for any questions or concerns.   Endometrial Biopsy, Care After This sheet gives you information about how to care for yourself after your procedure. Your health care provider may also give you more specific instructions. If you have problems or questions, contact your health care provider. What can I expect after the procedure? After the procedure, it is common to have:  Mild cramping.  A small amount of vaginal bleeding for a few days. This is normal. Follow these instructions at home:   Take over-the-counter and prescription medicines only as told by your health care provider.  Do not douche, use tampons, or have sexual intercourse until your health care provider approves.  Return to your normal activities as told by your health care provider. Ask your health care provider what activities are safe for you.  Follow instructions from your health care provider about any activity restrictions, such as restrictions on strenuous exercise or heavy lifting. Contact a health care provider if:  You have heavy bleeding, or bleed for longer than 2 days after the procedure.  You have bad smelling discharge from your vagina.  You have a fever or chills.  You have a burning sensation when urinating or you have difficulty urinating.  You have severe pain in your lower abdomen. Get help right away if:  You have severe cramps in your stomach or back.  You pass large blood clots.  Your bleeding increases.  You become weak or light-headed, or you pass out. Summary  After the procedure, it is common to have mild cramping and a small amount of vaginal bleeding for a few days.  Do not douche, use tampons, or have sexual intercourse until your health care provider approves.  Return to your normal activities as told by your health care  provider. Ask your health care provider what activities are safe for you. This information is not intended to replace advice given to you by your health care provider. Make sure you discuss any questions you have with your health care provider. Document Released: 09/12/2013 Document Revised: 12/08/2016 Document Reviewed: 12/08/2016 Elsevier Interactive Patient Education  2019 [employer].

## 2019-05-15 NOTE — Progress Notes (Signed)
\  GYN ONCOLOGY OFFICE VISIT    [name] [name] 68 y.o. 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  At that surgical encounter the uterus right ovary and fallopian tube were encased in disease and not removed. [name] [name] received adjuv Her daughter the ant Taxol carboplatin therapy and has been NED since.  Her Ca1 25 nadired at 5.2 however an elevation of the value to 23.1 was noted earlier this month.  CT scan of the abdomen and pelvis is notable for a cystic structure within the right adnexa measures 3 cm previously 2.8 cm.  The endometrium is thickened measuring 1.9 cm previously 1.5 cm.  A complex solid mass is noted within the right lower quadrant of the abdomen measuring 4.6 x 3.2 cm this is a new finding.  There is a small 5 mm groundglass nodule identified within the posterior left upper lobe that is new from the previous examination.  An endometrial  biopsy was collected today given the abnormal endometrial stripe.    CT findings are suspicious for either recurrent fallopian tube cancer or metastatic disease.  The right adnexa remains in place and its size has increased from prior imaging.  The dominant mass is separate from the adnexa and the ovary and represents metastatic disease.   If the endometrial biopsy is negative , I will have to weigh the pros and cons with [name] [name] of CT-guided biopsy versus a minimally invasive hysterectomy USO and debulking . In either scenario, irrespective of the primary site - fallopian tube, ovary, endometrium or breast-,  chemotherapy will be the mainstay of treatment.  HPI:   Fallopian tube cancer, BRCA2 positive ([place])   04/2012 Imaging     A CT scan was been obtained showing pleural effusion, significant abdominal ascites, an omental cake and carcinomatosis.    04/2012 Miscellaneous    CA 125 value was 656 units per mL CA 27.29 was 208 units per mL CEA was less than 0.3.      05/2012 Procedure   Pleurodesis  Cytology showed malignant cells consistent with adenocarcinoma.      06/06/2012 Surgery    Exploratory laparotomy with Dr. Fermin Schwab.  She was found to have diffuse metastatic high-grade serous carcinoma arising from the fallopian tube. At the time of surgery she had 6.5 L of ascites, the omentum was completely replaced by metastatic disease and diaphragms had metastatic disease on both surfaces. There was also considerable amount of disease in the pelvic peritoneum and bladder flap, precluding abdominal hysterectomy.. There is also considerable amount of metastatic disease around the appendix.   Appendectomy, total omentectomy, and left salpingo-oophorectomy were performed. The patient was suboptimally debulked..    06/2012 - 10/2012 Chemotherapy    6 cycles of carboplatin and Taxol chemotherapy.     06/2012 Initial Diagnosis    Fallopian tube cancer, BRCA2 positive ([place])    10/2012 Imaging     CT scan  No evidence of disease in the abdomen or pelvis and only residual small right pleural effusion     10/2012 Miscellaneous    CA125 10/2012       10.6 01/2018           5.2 02/2019          23    04/2019 Imaging    CT       Interval History: The patient returns today for continuing followup of stage IIIc fallopian tube cancer. She saw Dr. Hinton Rao 6  months ago. Since then she's done well she denies any GI or GU symptoms she denies any pelvic symptoms. CA-125 value on September 06, 2018 was 5.2  units per mL. She is up-to-date with mammograms and is not due for colonoscopy for a couple more years.  Review of Systems:Review of Systems  Constitutional: Negative for chills, fever and weight loss.  Respiratory: Negative for cough, hemoptysis and sputum production.   Cardiovascular: Negative for chest pain, leg swelling and PND.  Gastrointestinal: Negative for abdominal pain, nausea and vomiting.  Genitourinary: Negative for dysuria and urgency.  Skin: Negative for itching  and rash.  Psychiatric/Behavioral: Negative.     Vitals: Blood pressure (!) 184/74, pulse (!) 46, temperature 98.5 F (36.9 C), temperature source Oral, resp. rate 18, height 5' 5.75" (1.67 m), weight 181 lb (82.1 kg), SpO2 100 %. Body mass index is 29.44 kg/m.   Physical Exam: General : The patient is a healthy woman in no acute distress.  HEENT: normocephalic, extraoccular movements normal; neck is supple without thyromegally  Lynphnodes: Supraclavicular and inguinal nodes not enlarged  Abdomen: Soft, non-tender, no ascites, no organomegally, no masses, no hernias  Pelvic:  EGBUS: Normal female  Vagina: Normal mucosa. Urethra and Bladder: Normal, non-tender  Cervix: Normal, vaginal wall tag noted on the left vaginal sidewall firm nonfriable Uterus: Normal mobile, metria biopsy collected.  Cervix prepped with Betadine, and cavity sounded to 8 cm.  With 3 passes scant material was obtained. Bi-manual examination: No masses or nodularity. Rectal: normal sphincter tone, no masses, no stool, no blood  Lower extremities: No edema or varicosities. Normal range of motion   No Known Allergies  Past Medical History:  Diagnosis Date  . Anemia   . Arthritis    arthritis in back   . Ascites   . Breast cancer ([place])   . Collapsed vertebra ([place])    thoracic to lumbar vertebra   . Diabetes mellitus without complication ([place])   . GERD (gastroesophageal reflux disease)   . H/O hiatal hernia   . Hypercholesteremia   . Hypertension   . Lymph edema    left arm   . Shortness of breath    some due to ascites     Past Surgical History:  Procedure Laterality Date  . APPENDECTOMY  06/06/2012   Procedure: APPENDECTOMY;  Surgeon: Alvino Chapel, MD;  Location: [facility];  Service: Gynecology;;  . EYE SURGERY     as a child   . LAPAROTOMY  06/06/2012   Procedure: EXPLORATORY LAPAROTOMY;  Surgeon: Alvino Chapel, MD;  Location: [facility];  Service: Gynecology;  Laterality: N/A;  .  MASTECTOMY, PARTIAL  Jan 2005   Left  . SALPINGOOPHORECTOMY  06/06/2012   Procedure: SALPINGO OOPHERECTOMY;  Surgeon: Alvino Chapel, MD;  Location: [facility];  Service: Gynecology;  Laterality: Left;  . UTERINE FIBROID SURGERY  2003    Current Outpatient Medications  Medication Sig Dispense Refill  . cetirizine (ZYRTEC) 10 MG tablet Take 10 mg by mouth as needed.     [name] glimepiride (AMARYL) 1 MG tablet Take 1 mg by mouth daily with breakfast.     . losartan (COZAAR) 100 MG tablet Take 100 mg by mouth daily with breakfast.    . metFORMIN (GLUCOPHAGE-XR) 500 MG 24 hr tablet Take by mouth daily.     . metoprolol succinate (TOPROL-XL) 100 MG 24 hr tablet     . Multiple Vitamins-Minerals (CENTRUM SILVER ADULT 50+ PO) Take 1 tablet by mouth  daily.    [name] RELION CONFIRM/MICRO TEST test strip     . simvastatin (ZOCOR) 40 MG tablet Take 40 mg by mouth every evening.    . vitamin B-12 (CYANOCOBALAMIN) 500 MCG tablet Take 500 mcg by mouth daily.     No current facility-administered medications for this visit.     Social History   Socioeconomic History  . Marital status: Married    Spouse name: Not on file  . Number of children: Not on file  . Years of education: Not on file  . Highest education level: Not on file  Occupational History  . Not on file  Social Needs  . Financial resource strain: Not on file  . Food insecurity:    Worry: Not on file    Inability: Not on file  . Transportation needs:    Medical: Not on file    Non-medical: Not on file  Tobacco Use  . Smoking status: Former Smoker    Last attempt to quit: 12/06/2002    Years since quitting: 16.4  . Smokeless tobacco: Never Used  Substance and Sexual Activity  . Alcohol use: No  . Drug use: No  . Sexual activity: Not Currently  Lifestyle  . Physical activity:    Days per week: Not on file    Minutes per session: Not on file  . Stress: Not on file  Relationships  . Social connections:    Talks on phone: Not on  file    Gets together: Not on file    Attends religious service: Not on file    Active member of club or organization: Not on file    Attends meetings of clubs or organizations: Not on file    Relationship status: Not on file  . Intimate partner violence:    Fear of current or ex partner: Not on file    Emotionally abused: Not on file    Physically abused: Not on file    Forced sexual activity: Not on file  Other Topics Concern  . Not on file  Social History Narrative  . Not on file    Family History  Problem Relation Age of Onset  . Colon cancer Mother   . Uterine cancer Sister   . Uterine cancer Daughter   . Liver cancer Paternal Grandfather       Janie Morning, MD 05/15/2019, 11:10 AM

## 2019-05-16 DIAGNOSIS — R300 Dysuria: Secondary | ICD-10-CM | POA: Diagnosis not present

## 2019-05-17 ENCOUNTER — Telehealth: Payer: Self-pay | Admitting: Gynecologic Oncology

## 2019-05-17 NOTE — Telephone Encounter (Signed)
Discussed benign findings from EMB 05/15/2019.  Discussed course of care plan with Hosie Poisson.  Plan to bx the right pelvic mass.  If Gyn in origin will plan for taxol/carbo x 3 followed by interval hys uso then 3 cycles taxol/ carbo then PARP maintenance  If bx is consistent with breast recurrence will defer management to [place].  [name] and her husband are in agreement with this strategy

## 2019-05-18 ENCOUNTER — Other Ambulatory Visit: Payer: Self-pay | Admitting: Gynecologic Oncology

## 2019-05-18 ENCOUNTER — Telehealth: Payer: Self-pay | Admitting: Oncology

## 2019-05-18 DIAGNOSIS — R1900 Intra-abdominal and pelvic swelling, mass and lump, unspecified site: Secondary | ICD-10-CM

## 2019-05-18 DIAGNOSIS — C5700 Malignant neoplasm of unspecified fallopian tube: Secondary | ICD-10-CM

## 2019-05-18 NOTE — Telephone Encounter (Signed)
Faxed CT biopsy order to [facility] at [phone].

## 2019-05-18 NOTE — Telephone Encounter (Signed)
Left a message for [facility] to see if CT biopsy can be done there.

## 2019-05-18 NOTE — Telephone Encounter (Signed)
Received call from Dr. Earleen Newport from Radiology at [facility]  He said he does not think patient is a good candidate for biopsy and would like to discuss it with Dr. Skeet Latch. He was given Dr. Leone Brand contact information.

## 2019-05-21 DIAGNOSIS — Z853 Personal history of malignant neoplasm of breast: Secondary | ICD-10-CM | POA: Diagnosis not present

## 2019-05-21 DIAGNOSIS — C5702 Malignant neoplasm of left fallopian tube: Secondary | ICD-10-CM | POA: Diagnosis not present

## 2019-05-21 DIAGNOSIS — Z8544 Personal history of malignant neoplasm of other female genital organs: Secondary | ICD-10-CM | POA: Diagnosis not present

## 2019-05-21 DIAGNOSIS — R928 Other abnormal and inconclusive findings on diagnostic imaging of breast: Secondary | ICD-10-CM | POA: Diagnosis not present

## 2019-05-21 DIAGNOSIS — R300 Dysuria: Secondary | ICD-10-CM | POA: Diagnosis not present

## 2019-05-21 DIAGNOSIS — Z7901 Long term (current) use of anticoagulants: Secondary | ICD-10-CM | POA: Diagnosis not present

## 2019-05-28 ENCOUNTER — Ambulatory Visit (INDEPENDENT_AMBULATORY_CARE_PROVIDER_SITE_OTHER): Payer: Medicare HMO

## 2019-05-28 ENCOUNTER — Other Ambulatory Visit: Payer: Self-pay

## 2019-05-28 DIAGNOSIS — I447 Left bundle-branch block, unspecified: Secondary | ICD-10-CM

## 2019-05-28 DIAGNOSIS — I493 Ventricular premature depolarization: Secondary | ICD-10-CM

## 2019-05-28 NOTE — Progress Notes (Signed)
2D Echocardiogram performed  05/28/19 Cardell Peach

## 2019-05-29 DIAGNOSIS — Z5111 Encounter for antineoplastic chemotherapy: Secondary | ICD-10-CM | POA: Diagnosis not present

## 2019-05-29 DIAGNOSIS — Z452 Encounter for adjustment and management of vascular access device: Secondary | ICD-10-CM | POA: Diagnosis not present

## 2019-05-29 DIAGNOSIS — C5702 Malignant neoplasm of left fallopian tube: Secondary | ICD-10-CM | POA: Diagnosis not present

## 2019-05-29 DIAGNOSIS — C5700 Malignant neoplasm of unspecified fallopian tube: Secondary | ICD-10-CM | POA: Diagnosis not present

## 2019-05-31 DIAGNOSIS — I493 Ventricular premature depolarization: Secondary | ICD-10-CM | POA: Diagnosis not present

## 2019-05-31 DIAGNOSIS — Z853 Personal history of malignant neoplasm of breast: Secondary | ICD-10-CM | POA: Diagnosis not present

## 2019-05-31 DIAGNOSIS — Z5111 Encounter for antineoplastic chemotherapy: Secondary | ICD-10-CM | POA: Diagnosis not present

## 2019-05-31 DIAGNOSIS — C5702 Malignant neoplasm of left fallopian tube: Secondary | ICD-10-CM | POA: Diagnosis not present

## 2019-05-31 DIAGNOSIS — Z8544 Personal history of malignant neoplasm of other female genital organs: Secondary | ICD-10-CM | POA: Diagnosis not present

## 2019-05-31 DIAGNOSIS — R928 Other abnormal and inconclusive findings on diagnostic imaging of breast: Secondary | ICD-10-CM | POA: Diagnosis not present

## 2019-06-01 DIAGNOSIS — Z5189 Encounter for other specified aftercare: Secondary | ICD-10-CM | POA: Diagnosis not present

## 2019-06-01 DIAGNOSIS — C5702 Malignant neoplasm of left fallopian tube: Secondary | ICD-10-CM | POA: Diagnosis not present

## 2019-06-01 DIAGNOSIS — Z853 Personal history of malignant neoplasm of breast: Secondary | ICD-10-CM | POA: Diagnosis not present

## 2019-06-04 ENCOUNTER — Telehealth: Payer: Self-pay | Admitting: Emergency Medicine

## 2019-06-04 DIAGNOSIS — I493 Ventricular premature depolarization: Secondary | ICD-10-CM

## 2019-06-04 NOTE — Telephone Encounter (Signed)
Patient will be scheduled for lexiscan per Dr. Agustin Cree.

## 2019-06-06 NOTE — Telephone Encounter (Signed)
After contacting patient she reports she is currently on chemotherapy and will not be able to have a stress test at this time. Appointment cancelled. Dr. Agustin Cree aware.

## 2019-06-07 DIAGNOSIS — R1900 Intra-abdominal and pelvic swelling, mass and lump, unspecified site: Secondary | ICD-10-CM | POA: Diagnosis not present

## 2019-06-07 DIAGNOSIS — C50419 Malignant neoplasm of upper-outer quadrant of unspecified female breast: Secondary | ICD-10-CM

## 2019-06-07 DIAGNOSIS — C5700 Malignant neoplasm of unspecified fallopian tube: Secondary | ICD-10-CM

## 2019-06-07 DIAGNOSIS — I972 Postmastectomy lymphedema syndrome: Secondary | ICD-10-CM

## 2019-06-07 DIAGNOSIS — Z853 Personal history of malignant neoplasm of breast: Secondary | ICD-10-CM | POA: Diagnosis not present

## 2019-06-07 DIAGNOSIS — D649 Anemia, unspecified: Secondary | ICD-10-CM

## 2019-06-11 ENCOUNTER — Telehealth: Payer: Self-pay | Admitting: *Deleted

## 2019-06-11 NOTE — Telephone Encounter (Signed)
Pt got letter in mail for ins info so called [employer] and gave them the info.

## 2019-06-21 DIAGNOSIS — R1903 Right lower quadrant abdominal swelling, mass and lump: Secondary | ICD-10-CM | POA: Diagnosis not present

## 2019-06-21 DIAGNOSIS — Z853 Personal history of malignant neoplasm of breast: Secondary | ICD-10-CM | POA: Diagnosis not present

## 2019-06-21 DIAGNOSIS — E041 Nontoxic single thyroid nodule: Secondary | ICD-10-CM | POA: Diagnosis not present

## 2019-06-21 DIAGNOSIS — C5702 Malignant neoplasm of left fallopian tube: Secondary | ICD-10-CM | POA: Diagnosis not present

## 2019-06-21 DIAGNOSIS — E1165 Type 2 diabetes mellitus with hyperglycemia: Secondary | ICD-10-CM | POA: Diagnosis not present

## 2019-06-21 DIAGNOSIS — C7800 Secondary malignant neoplasm of unspecified lung: Secondary | ICD-10-CM | POA: Diagnosis not present

## 2019-06-22 DIAGNOSIS — E041 Nontoxic single thyroid nodule: Secondary | ICD-10-CM | POA: Diagnosis not present

## 2019-06-22 DIAGNOSIS — C7802 Secondary malignant neoplasm of left lung: Secondary | ICD-10-CM | POA: Diagnosis not present

## 2019-06-22 DIAGNOSIS — Z853 Personal history of malignant neoplasm of breast: Secondary | ICD-10-CM | POA: Diagnosis not present

## 2019-06-22 DIAGNOSIS — C7801 Secondary malignant neoplasm of right lung: Secondary | ICD-10-CM | POA: Diagnosis not present

## 2019-06-22 DIAGNOSIS — C5702 Malignant neoplasm of left fallopian tube: Secondary | ICD-10-CM | POA: Diagnosis not present

## 2019-06-22 DIAGNOSIS — E119 Type 2 diabetes mellitus without complications: Secondary | ICD-10-CM | POA: Diagnosis not present

## 2019-06-22 DIAGNOSIS — Z5189 Encounter for other specified aftercare: Secondary | ICD-10-CM | POA: Diagnosis not present

## 2019-06-22 DIAGNOSIS — Z8544 Personal history of malignant neoplasm of other female genital organs: Secondary | ICD-10-CM | POA: Diagnosis not present

## 2019-06-26 ENCOUNTER — Telehealth: Payer: Medicare HMO | Admitting: Cardiology

## 2019-07-12 DIAGNOSIS — T380X5A Adverse effect of glucocorticoids and synthetic analogues, initial encounter: Secondary | ICD-10-CM | POA: Diagnosis not present

## 2019-07-12 DIAGNOSIS — Z853 Personal history of malignant neoplasm of breast: Secondary | ICD-10-CM | POA: Diagnosis not present

## 2019-07-12 DIAGNOSIS — C5702 Malignant neoplasm of left fallopian tube: Secondary | ICD-10-CM | POA: Diagnosis not present

## 2019-07-12 DIAGNOSIS — X58XXXA Exposure to other specified factors, initial encounter: Secondary | ICD-10-CM | POA: Diagnosis not present

## 2019-07-12 DIAGNOSIS — I499 Cardiac arrhythmia, unspecified: Secondary | ICD-10-CM | POA: Diagnosis not present

## 2019-07-12 DIAGNOSIS — R739 Hyperglycemia, unspecified: Secondary | ICD-10-CM | POA: Diagnosis not present

## 2019-07-12 DIAGNOSIS — Z515 Encounter for palliative care: Secondary | ICD-10-CM | POA: Diagnosis not present

## 2019-07-12 DIAGNOSIS — C7802 Secondary malignant neoplasm of left lung: Secondary | ICD-10-CM | POA: Diagnosis not present

## 2019-07-12 DIAGNOSIS — C7801 Secondary malignant neoplasm of right lung: Secondary | ICD-10-CM | POA: Diagnosis not present

## 2019-07-12 DIAGNOSIS — R9389 Abnormal findings on diagnostic imaging of other specified body structures: Secondary | ICD-10-CM | POA: Diagnosis not present

## 2019-07-13 DIAGNOSIS — C7801 Secondary malignant neoplasm of right lung: Secondary | ICD-10-CM | POA: Diagnosis not present

## 2019-07-13 DIAGNOSIS — C7802 Secondary malignant neoplasm of left lung: Secondary | ICD-10-CM | POA: Diagnosis not present

## 2019-07-13 DIAGNOSIS — C5702 Malignant neoplasm of left fallopian tube: Secondary | ICD-10-CM | POA: Diagnosis not present

## 2019-07-30 DIAGNOSIS — E7800 Pure hypercholesterolemia, unspecified: Secondary | ICD-10-CM | POA: Diagnosis not present

## 2019-07-30 DIAGNOSIS — E663 Overweight: Secondary | ICD-10-CM | POA: Diagnosis not present

## 2019-07-30 DIAGNOSIS — E041 Nontoxic single thyroid nodule: Secondary | ICD-10-CM | POA: Diagnosis not present

## 2019-07-30 DIAGNOSIS — E785 Hyperlipidemia, unspecified: Secondary | ICD-10-CM | POA: Diagnosis not present

## 2019-07-30 DIAGNOSIS — Z79899 Other long term (current) drug therapy: Secondary | ICD-10-CM | POA: Diagnosis not present

## 2019-07-30 DIAGNOSIS — Z6829 Body mass index (BMI) 29.0-29.9, adult: Secondary | ICD-10-CM | POA: Diagnosis not present

## 2019-07-30 DIAGNOSIS — R69 Illness, unspecified: Secondary | ICD-10-CM | POA: Diagnosis not present

## 2019-07-30 DIAGNOSIS — I10 Essential (primary) hypertension: Secondary | ICD-10-CM | POA: Diagnosis not present

## 2019-07-30 DIAGNOSIS — E1169 Type 2 diabetes mellitus with other specified complication: Secondary | ICD-10-CM | POA: Diagnosis not present

## 2019-07-30 DIAGNOSIS — G629 Polyneuropathy, unspecified: Secondary | ICD-10-CM | POA: Diagnosis not present

## 2019-07-30 DIAGNOSIS — E1142 Type 2 diabetes mellitus with diabetic polyneuropathy: Secondary | ICD-10-CM | POA: Diagnosis not present

## 2019-07-31 DIAGNOSIS — R69 Illness, unspecified: Secondary | ICD-10-CM | POA: Diagnosis not present

## 2019-08-09 DIAGNOSIS — R911 Solitary pulmonary nodule: Secondary | ICD-10-CM | POA: Diagnosis not present

## 2019-08-09 DIAGNOSIS — R9389 Abnormal findings on diagnostic imaging of other specified body structures: Secondary | ICD-10-CM | POA: Diagnosis not present

## 2019-08-09 DIAGNOSIS — N2889 Other specified disorders of kidney and ureter: Secondary | ICD-10-CM | POA: Diagnosis not present

## 2019-08-09 DIAGNOSIS — C5702 Malignant neoplasm of left fallopian tube: Secondary | ICD-10-CM | POA: Diagnosis not present

## 2019-08-10 DIAGNOSIS — C7801 Secondary malignant neoplasm of right lung: Secondary | ICD-10-CM | POA: Diagnosis not present

## 2019-08-10 DIAGNOSIS — E041 Nontoxic single thyroid nodule: Secondary | ICD-10-CM | POA: Diagnosis not present

## 2019-08-10 DIAGNOSIS — D649 Anemia, unspecified: Secondary | ICD-10-CM | POA: Diagnosis not present

## 2019-08-10 DIAGNOSIS — C50419 Malignant neoplasm of upper-outer quadrant of unspecified female breast: Secondary | ICD-10-CM | POA: Diagnosis not present

## 2019-08-10 DIAGNOSIS — E119 Type 2 diabetes mellitus without complications: Secondary | ICD-10-CM | POA: Diagnosis not present

## 2019-08-10 DIAGNOSIS — C5700 Malignant neoplasm of unspecified fallopian tube: Secondary | ICD-10-CM | POA: Diagnosis not present

## 2019-08-10 DIAGNOSIS — Z853 Personal history of malignant neoplasm of breast: Secondary | ICD-10-CM | POA: Diagnosis not present

## 2019-08-10 DIAGNOSIS — C5702 Malignant neoplasm of left fallopian tube: Secondary | ICD-10-CM | POA: Diagnosis not present

## 2019-08-10 DIAGNOSIS — Z8544 Personal history of malignant neoplasm of other female genital organs: Secondary | ICD-10-CM | POA: Diagnosis not present

## 2019-08-10 DIAGNOSIS — I972 Postmastectomy lymphedema syndrome: Secondary | ICD-10-CM | POA: Diagnosis not present

## 2019-08-15 NOTE — Progress Notes (Signed)
\  GYN ONCOLOGY OFFICE VISIT   CHIEF COMPLAINT:   Fallopian tube cancer   [name] [name] 68 y.o. 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  At that surgical encounter with Dr. Fermin Schwab the uterus right ovary and fallopian tube were encased in disease and not removed. [name] [name] received adjuvant Taxol carboplatin therapy.  Recurrence identified 6/20.  SOL ODOR is s/p 3 cyclesycles.  Repeat imaging 08/2019 with stable adnexal mass but a reduction in the size of the mesenteric disease.  Management of the ovarian cancer is challenged by the fact that the ovaries are still present.  Recent prospective randomized trial does not indicate a benefit to secondary debulking for recurrence.  I would not recommend a procedure while there is mesenteric disease present.  When there is evidence of resolution of the metastatic disease then consideration should be given to removal of the adnexa.     HPI: Oncology History  Fallopian tube cancer, BRCA2 positive ([place])  04/2012 Imaging    A CT scan was been obtained showing pleural effusion, significant abdominal ascites, an omental cake and carcinomatosis.   04/2012 Miscellaneous   CA 125 value was 656 units per mL CA 27.29 was 208 units per mL CEA was less than 0.3.     05/2012 Procedure   Pleurodesis  Cytology showed malignant cells consistent with adenocarcinoma.     06/06/2012 Surgery   Exploratory laparotomy with Dr. Fermin Schwab.  She was found to have diffuse metastatic high-grade serous carcinoma arising from the fallopian tube. At the time of surgery she had 6.5 L of ascites, the omentum was completely replaced by metastatic disease and diaphragms had metastatic disease on both surfaces. There was also considerable amount of disease in the pelvic peritoneum and bladder flap, precluding abdominal hysterectomy.. There is also considerable amount of metastatic disease around the appendix.    Appendectomy, total omentectomy, and left salpingo-oophorectomy were performed. The patient was suboptimally debulked. IIIC fallopian tube cancer   06/2012 - 10/2012 Chemotherapy   6 cycles of carboplatin and Taxol chemotherapy.    06/2012 Initial Diagnosis   Fallopian tube cancer, BRCA2 positive ([place])   10/2012 Imaging    CT scan  No evidence of disease in the abdomen or pelvis and only residual small right pleural effusion    10/2012 Miscellaneous   CA125 10/2012       10.6 01/2018           5.2 02/2019          23   04/2019 Imaging   CT    05/2019 Pathology Results   EMB Diagnosis Endometrium, biopsy - BENIGN ENDOMETRIOID POLYP - NEGATIVE FOR HYPERPLASIA OR MALIGNANCY   08/09/2019 Imaging   CT chest Trace right pleural fluid,  5 mm posterior left upper lobe pulmonary nodule stable from prior imaging on 05/11/2019 CT abdomen and pelvis Right pelvic small bowel mesenteric mass measures 2.1 x 1.5 cm on current examination versus 4.3 x 3.4 in the prior examination No new or progressive disease Persistent endometrial thickening and complex right ovarian cystic lesion Probable complex cyst in the lower pole the left kidney      Interval History:   Her Ca1 25 nadired at 5.2 however an elevation of the value to 23.1 was noted 05/2019.   CT scan of the abdomen and pelvis is notable for a cystic structure within the right adnexa measures 3 cm previously 2.8 cm.  The endometrium is  thickened measuring 1.9 cm previously 1.5 cm.  A complex solid mass is noted within the right lower quadrant of the abdomen measuring 4.6 x 3.2 cm this is a new finding.  There is a small 5 mm groundglass nodule identified within the posterior left upper lobe that is new from the previous examination.  An endometrial  biopsy was collected today given the abnormal endometrial stripe.   She has received chemotherapy with reduction of CA 125.    Review of Systems:Review of Systems  Constitutional: Negative  for chills, fever and weight loss.  Respiratory: Negative for cough, hemoptysis and sputum production.   Cardiovascular: Negative for chest pain, leg swelling and PND.  Gastrointestinal: Negative for abdominal pain, constipation, diarrhea, nausea and vomiting.  Genitourinary: Negative for dysuria and urgency.  Musculoskeletal: Negative.   Skin: Negative for itching and rash.  Psychiatric/Behavioral: Negative.     Vitals: Blood pressure (!) 180/72, pulse (!) 48, temperature 98.5 F (36.9 C), temperature source Temporal, resp. rate 18, height _0  (1.651 m), weight 184 lb (83.5 kg), SpO2 98 %. There is no height or weight on file to calculate BMI.   Physical Exam: BP (!) 180/72 (BP Location: Right Arm, Patient Position: Sitting)   Pulse (!) 48   Temp 98.5 F (36.9 C) (Temporal)   Resp 18   Ht _1  (1.651 m)   Wt 184 lb (83.5 kg)   SpO2 98%   BMI 30.62 kg/m   General : The patient is a healthy woman in no acute distress. Alopecia HEENT: normocephalic, extraoccular movements normal; neck is supple without thyromegally  Lynphnodes: Supraclavicular and inguinal nodes not enlarged  Abdomen: Soft, non-tender, no ascites, no organomegally, no masses, no hernias  Pelvic:  EGBUS: Normal female  Vagina: Normal mucosa. Urethra and Bladder: Normal, non-tender  Cervix: Normal, vaginal wall tag noted on the left vaginal sidewall firm nonfriable Uterus: Normal mobile, Bi-manual examination: No masses or nodularity. Rectal: normal sphincter tone, no masses, no stool, no blood  Lower extremities: No edema or varicosities. Normal range of motion   No Known Allergies  Past Medical History:  Diagnosis Date  . Anemia   . Arthritis    arthritis in back   . Ascites   . Breast cancer ([place])   . Collapsed vertebra ([place])    thoracic to lumbar vertebra   . Diabetes mellitus without complication ([place])   . GERD (gastroesophageal reflux disease)   . H/O hiatal hernia   . Hypercholesteremia    . Hypertension   . Lymph edema    left arm   . Shortness of breath    some due to ascites     Past Surgical History:  Procedure Laterality Date  . APPENDECTOMY  06/06/2012   Procedure: APPENDECTOMY;  Surgeon: Alvino Chapel, MD;  Location: [facility];  Service: Gynecology;;  . EYE SURGERY     as a child   . LAPAROTOMY  06/06/2012   Procedure: EXPLORATORY LAPAROTOMY;  Surgeon: Alvino Chapel, MD;  Location: [facility];  Service: Gynecology;  Laterality: N/A;  . MASTECTOMY, PARTIAL  Jan 2005   Left  . SALPINGOOPHORECTOMY  06/06/2012   Procedure: SALPINGO OOPHERECTOMY;  Surgeon: Alvino Chapel, MD;  Location: [facility];  Service: Gynecology;  Laterality: Left;  . UTERINE FIBROID SURGERY  2003    Current Outpatient Medications  Medication Sig Dispense Refill  . cetirizine (ZYRTEC) 10 MG tablet Take 10 mg by mouth as needed.     [name] glimepiride (  AMARYL) 1 MG tablet Take 1 mg by mouth daily with breakfast.     . losartan (COZAAR) 100 MG tablet Take 100 mg by mouth daily with breakfast.    . metFORMIN (GLUCOPHAGE-XR) 500 MG 24 hr tablet Take by mouth daily.     . metoprolol succinate (TOPROL-XL) 100 MG 24 hr tablet     . Multiple Vitamins-Minerals (CENTRUM SILVER ADULT 50+ PO) Take 1 tablet by mouth daily.    [name] RELION CONFIRM/MICRO TEST test strip     . simvastatin (ZOCOR) 40 MG tablet Take 40 mg by mouth every evening.    . vitamin B-12 (CYANOCOBALAMIN) 500 MCG tablet Take 500 mcg by mouth daily.     No current facility-administered medications for this visit.     Social History   Socioeconomic History  . Marital status: Married    Spouse name: Not on file  . Number of children: Not on file  . Years of education: Not on file  . Highest education level: Not on file  Occupational History  . Not on file  Social Needs  . Financial resource strain: Not on file  . Food insecurity    Worry: Not on file    Inability: Not on file  . Transportation needs    Medical: Not  on file    Non-medical: Not on file  Tobacco Use  . Smoking status: Former Smoker    Quit date: 12/06/2002    Years since quitting: 16.7  . Smokeless tobacco: Never Used  Substance and Sexual Activity  . Alcohol use: No  . Drug use: No  . Sexual activity: Not Currently  Lifestyle  . Physical activity    Days per week: Not on file    Minutes per session: Not on file  . Stress: Not on file  Relationships  . Social Herbalist on phone: Not on file    Gets together: Not on file    Attends religious service: Not on file    Active member of club or organization: Not on file    Attends meetings of clubs or organizations: Not on file    Relationship status: Not on file  . Intimate partner violence    Fear of current or ex partner: Not on file    Emotionally abused: Not on file    Physically abused: Not on file    Forced sexual activity: Not on file  Other Topics Concern  . Not on file  Social History Narrative  . Not on file    Family History  Problem Relation Age of Onset  . Colon cancer Mother   . Uterine cancer Sister   . Uterine cancer Daughter   . Liver cancer Paternal Grandfather     Janie Morning, MD 08/15/2019, 9:31 PM

## 2019-08-16 ENCOUNTER — Other Ambulatory Visit: Payer: Self-pay

## 2019-08-16 ENCOUNTER — Inpatient Hospital Stay: Payer: Medicare HMO | Attending: Gynecologic Oncology | Admitting: Gynecologic Oncology

## 2019-08-16 VITALS — BP 180/72 | HR 48 | Temp 98.5°F | Resp 18 | Ht 65.0 in | Wt 184.0 lb

## 2019-08-16 DIAGNOSIS — C5701 Malignant neoplasm of right fallopian tube: Secondary | ICD-10-CM | POA: Diagnosis not present

## 2019-08-16 DIAGNOSIS — E119 Type 2 diabetes mellitus without complications: Secondary | ICD-10-CM | POA: Insufficient documentation

## 2019-08-16 DIAGNOSIS — R188 Other ascites: Secondary | ICD-10-CM | POA: Insufficient documentation

## 2019-08-16 DIAGNOSIS — E7800 Pure hypercholesterolemia, unspecified: Secondary | ICD-10-CM | POA: Insufficient documentation

## 2019-08-16 DIAGNOSIS — K219 Gastro-esophageal reflux disease without esophagitis: Secondary | ICD-10-CM | POA: Insufficient documentation

## 2019-08-16 DIAGNOSIS — C5700 Malignant neoplasm of unspecified fallopian tube: Secondary | ICD-10-CM

## 2019-08-16 DIAGNOSIS — I10 Essential (primary) hypertension: Secondary | ICD-10-CM | POA: Insufficient documentation

## 2019-08-16 DIAGNOSIS — Z79899 Other long term (current) drug therapy: Secondary | ICD-10-CM | POA: Diagnosis not present

## 2019-08-16 DIAGNOSIS — Z9221 Personal history of antineoplastic chemotherapy: Secondary | ICD-10-CM | POA: Diagnosis not present

## 2019-08-16 DIAGNOSIS — Z7984 Long term (current) use of oral hypoglycemic drugs: Secondary | ICD-10-CM | POA: Insufficient documentation

## 2019-08-16 NOTE — Patient Instructions (Signed)
Follow-up with Dr. Fermin Schwab in 4 months  Thank you very much Ms. [name] for allowing me to provide care for you today.  I appreciate your confidence in choosing our Gynecologic Oncology team.  If you have any questions about your visit today please call our office and we will get back to you as soon as possible.  Please consider using the website Medlineplus.gov as an Geneticist, molecular.   Francetta Found. Crystalle Popwell MD., PhD Gynecologic Oncology

## 2019-08-29 ENCOUNTER — Ambulatory Visit
Admission: RE | Admit: 2019-08-29 | Discharge: 2019-08-29 | Disposition: A | Discharge status: Home / Self Care | Payer: Self-pay | Source: Ambulatory Visit | Attending: Gynecologic Oncology | Admitting: Gynecologic Oncology

## 2019-08-29 ENCOUNTER — Telehealth: Payer: Self-pay

## 2019-08-29 DIAGNOSIS — C5700 Malignant neoplasm of unspecified fallopian tube: Secondary | ICD-10-CM

## 2019-08-29 DIAGNOSIS — Z1501 Genetic susceptibility to malignant neoplasm of breast: Secondary | ICD-10-CM

## 2019-08-29 NOTE — Telephone Encounter (Signed)
Entered orders for outside CT chest, abdomen, and pelvis from [facility] from 08-09-2019 and 05-11-2019 to be up loaded to Epic.

## 2019-09-06 DIAGNOSIS — Z23 Encounter for immunization: Secondary | ICD-10-CM | POA: Diagnosis not present

## 2019-09-06 DIAGNOSIS — Z853 Personal history of malignant neoplasm of breast: Secondary | ICD-10-CM | POA: Diagnosis not present

## 2019-09-06 DIAGNOSIS — E119 Type 2 diabetes mellitus without complications: Secondary | ICD-10-CM | POA: Diagnosis not present

## 2019-09-06 DIAGNOSIS — E041 Nontoxic single thyroid nodule: Secondary | ICD-10-CM | POA: Diagnosis not present

## 2019-09-06 DIAGNOSIS — Z5111 Encounter for antineoplastic chemotherapy: Secondary | ICD-10-CM | POA: Diagnosis not present

## 2019-09-06 DIAGNOSIS — C5702 Malignant neoplasm of left fallopian tube: Secondary | ICD-10-CM | POA: Diagnosis not present

## 2019-09-06 DIAGNOSIS — C7801 Secondary malignant neoplasm of right lung: Secondary | ICD-10-CM | POA: Diagnosis not present

## 2019-09-07 DIAGNOSIS — C7801 Secondary malignant neoplasm of right lung: Secondary | ICD-10-CM | POA: Diagnosis not present

## 2019-09-07 DIAGNOSIS — E041 Nontoxic single thyroid nodule: Secondary | ICD-10-CM | POA: Diagnosis not present

## 2019-09-07 DIAGNOSIS — Z5189 Encounter for other specified aftercare: Secondary | ICD-10-CM | POA: Diagnosis not present

## 2019-09-07 DIAGNOSIS — C5702 Malignant neoplasm of left fallopian tube: Secondary | ICD-10-CM | POA: Diagnosis not present

## 2019-09-07 DIAGNOSIS — E119 Type 2 diabetes mellitus without complications: Secondary | ICD-10-CM | POA: Diagnosis not present

## 2019-09-27 DIAGNOSIS — E119 Type 2 diabetes mellitus without complications: Secondary | ICD-10-CM | POA: Diagnosis not present

## 2019-09-27 DIAGNOSIS — C5700 Malignant neoplasm of unspecified fallopian tube: Secondary | ICD-10-CM | POA: Diagnosis not present

## 2019-09-27 DIAGNOSIS — C5702 Malignant neoplasm of left fallopian tube: Secondary | ICD-10-CM | POA: Diagnosis not present

## 2019-09-27 DIAGNOSIS — E041 Nontoxic single thyroid nodule: Secondary | ICD-10-CM | POA: Diagnosis not present

## 2019-09-27 DIAGNOSIS — C7801 Secondary malignant neoplasm of right lung: Secondary | ICD-10-CM | POA: Diagnosis not present

## 2019-09-27 DIAGNOSIS — Z853 Personal history of malignant neoplasm of breast: Secondary | ICD-10-CM | POA: Diagnosis not present

## 2019-09-28 DIAGNOSIS — E041 Nontoxic single thyroid nodule: Secondary | ICD-10-CM | POA: Diagnosis not present

## 2019-09-28 DIAGNOSIS — Z5189 Encounter for other specified aftercare: Secondary | ICD-10-CM | POA: Diagnosis not present

## 2019-09-28 DIAGNOSIS — E119 Type 2 diabetes mellitus without complications: Secondary | ICD-10-CM | POA: Diagnosis not present

## 2019-09-28 DIAGNOSIS — C5702 Malignant neoplasm of left fallopian tube: Secondary | ICD-10-CM | POA: Diagnosis not present

## 2019-09-28 DIAGNOSIS — C7801 Secondary malignant neoplasm of right lung: Secondary | ICD-10-CM | POA: Diagnosis not present

## 2019-10-18 DIAGNOSIS — C5700 Malignant neoplasm of unspecified fallopian tube: Secondary | ICD-10-CM | POA: Diagnosis not present

## 2019-10-18 DIAGNOSIS — E119 Type 2 diabetes mellitus without complications: Secondary | ICD-10-CM | POA: Diagnosis not present

## 2019-10-18 DIAGNOSIS — E041 Nontoxic single thyroid nodule: Secondary | ICD-10-CM | POA: Diagnosis not present

## 2019-10-18 DIAGNOSIS — C5702 Malignant neoplasm of left fallopian tube: Secondary | ICD-10-CM | POA: Diagnosis not present

## 2019-10-18 DIAGNOSIS — Z853 Personal history of malignant neoplasm of breast: Secondary | ICD-10-CM | POA: Diagnosis not present

## 2019-10-18 DIAGNOSIS — C7801 Secondary malignant neoplasm of right lung: Secondary | ICD-10-CM | POA: Diagnosis not present

## 2019-10-19 DIAGNOSIS — E119 Type 2 diabetes mellitus without complications: Secondary | ICD-10-CM | POA: Diagnosis not present

## 2019-10-19 DIAGNOSIS — E041 Nontoxic single thyroid nodule: Secondary | ICD-10-CM | POA: Diagnosis not present

## 2019-10-19 DIAGNOSIS — C5702 Malignant neoplasm of left fallopian tube: Secondary | ICD-10-CM | POA: Diagnosis not present

## 2019-10-19 DIAGNOSIS — Z5189 Encounter for other specified aftercare: Secondary | ICD-10-CM | POA: Diagnosis not present

## 2019-10-19 DIAGNOSIS — C7801 Secondary malignant neoplasm of right lung: Secondary | ICD-10-CM | POA: Diagnosis not present

## 2019-11-16 DIAGNOSIS — R911 Solitary pulmonary nodule: Secondary | ICD-10-CM | POA: Diagnosis not present

## 2019-11-16 DIAGNOSIS — C5702 Malignant neoplasm of left fallopian tube: Secondary | ICD-10-CM | POA: Diagnosis not present

## 2019-11-16 DIAGNOSIS — K449 Diaphragmatic hernia without obstruction or gangrene: Secondary | ICD-10-CM | POA: Diagnosis not present

## 2019-11-16 DIAGNOSIS — N289 Disorder of kidney and ureter, unspecified: Secondary | ICD-10-CM | POA: Diagnosis not present

## 2019-11-19 DIAGNOSIS — E119 Type 2 diabetes mellitus without complications: Secondary | ICD-10-CM | POA: Diagnosis not present

## 2019-11-19 DIAGNOSIS — Z853 Personal history of malignant neoplasm of breast: Secondary | ICD-10-CM | POA: Diagnosis not present

## 2019-11-19 DIAGNOSIS — C5700 Malignant neoplasm of unspecified fallopian tube: Secondary | ICD-10-CM | POA: Diagnosis not present

## 2019-11-19 DIAGNOSIS — C5702 Malignant neoplasm of left fallopian tube: Secondary | ICD-10-CM | POA: Diagnosis not present

## 2019-11-19 DIAGNOSIS — C7801 Secondary malignant neoplasm of right lung: Secondary | ICD-10-CM | POA: Diagnosis not present

## 2019-11-19 DIAGNOSIS — E041 Nontoxic single thyroid nodule: Secondary | ICD-10-CM | POA: Diagnosis not present

## 2019-11-19 NOTE — Progress Notes (Signed)
I connected with [name] [name] LEXNTZG YFVCB on 11/21/19 at 12:00 PM EST by phone visit. and verified that I am speaking with the correct person using two identifiers.   I discussed the limitations, risks, security and privacy concerns of performing an evaluation and management service by telemedicine and the availability of in-person appointments. I also discussed with the patient that there may be a patient responsible charge related to this service. The patient expressed understanding and agreed to proceed.   Other persons participating in the visit and their role in the encounter: Husband was on speaker phone  Patient's location: Home Provider's location: [place] [facility]   Chief Complaint: ovarian cancer   CHIEF COMPLAINT:   Fallopian tube cancer   [name] 68 y.o. 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  At that surgical encounter with Dr. Fermin [name] the uterus right ovary and fallopian tube were encased in disease and not removed. [name] [name] [facility] [name] [name] received adjuvant Taxol carboplatin therapy.  Recurrence identified 6/20.  [name] Godeaux SWHQPRF [name] is s/p 3 cyclesycles.  Repeat imaging 11/2019 with stable adnexal mass but a reduction in the size of the mesenteric disease.  While I have not physically examined her, based on imaging it seems like she would be a candidate for resection and most likely would be able to have an R0 resection.  She states that currently with the pandemic she does not wish to have surgery.  She did speak to Dr. Hinton [name] about proceeding with a PARP inhibitor.  I am not sure if they are considering olaparib or one of the other ones.  The patient would prefer to start that with the plan to repeat imaging in 3 months.  At that time if she still has persistent though not worsened disease, she would be willing to proceed with surgery.  I discussed with her that we will  coordinate with Dr. Remi [name] office for [place] to get a copy of that CT scan and then see her in person the clinic.  She understands that she will need to undergo a physical exam for surgical planning purposes and for additional discussion.  Her questions were elicited and answered to her satisfaction.  We will forward a copy of's note to Dr. Hinton [name].  25 minutes total time was spent with 10 minutes on the phone with the patient and her husband and additional 15 minutes preparing the note in reviewing prior records.   HPI: Oncology History  Fallopian tube cancer, BRCA2 positive ([place])  04/2012 Imaging    A CT scan was been obtained showing pleural effusion, significant abdominal ascites, an omental cake and carcinomatosis.   04/2012 Miscellaneous   CA 125 value was 656 units per mL CA 27.29 was 208 units per mL CEA was less than 0.3.     05/2012 Procedure   Pleurodesis  Cytology showed malignant cells consistent with adenocarcinoma.     06/06/2012 Surgery   Exploratory laparotomy with Dr. Fermin [name].  She was found to have diffuse metastatic high-grade serous carcinoma arising from the fallopian tube. At the time of surgery she had 6.5 L of ascites, the omentum was completely replaced by metastatic disease and diaphragms had metastatic disease on both surfaces. There was also considerable amount of disease in the pelvic peritoneum and bladder flap, precluding abdominal hysterectomy.. There is also considerable amount of metastatic disease around the appendix.   Appendectomy, total omentectomy, and left salpingo-oophorectomy were performed.  The patient was suboptimally debulked. IIIC fallopian tube cancer   06/2012 - 10/2012 Chemotherapy   6 cycles of carboplatin and Taxol chemotherapy.    06/2012 Initial Diagnosis   Fallopian tube cancer, BRCA2 positive ([place])   10/2012 Imaging    CT scan  No evidence of disease in the abdomen or pelvis and only residual small right pleural effusion     10/2012 Miscellaneous   CA125 10/2012       10.6 01/2018           5.2 02/2019          23   04/2019 Imaging   CT    05/2019 Pathology Results   EMB Diagnosis Endometrium, biopsy - BENIGN ENDOMETRIOID POLYP - NEGATIVE FOR HYPERPLASIA OR MALIGNANCY   08/09/2019 Imaging   CT chest Trace right pleural fluid,  5 mm posterior left upper lobe pulmonary nodule stable from prior imaging on 05/11/2019 CT abdomen and pelvis Right pelvic small bowel mesenteric mass measures 2.1 x 1.5 cm on current examination versus 4.3 x 3.4 in the prior examination No new or progressive disease Persistent endometrial thickening and complex right ovarian cystic lesion Probable complex cyst in the lower pole the left kidney    Interval History:   Her Ca125 nadired at 5.2 however an elevation of the value to 23.1 was noted 05/2019.   CT scan of the abdomen and pelvis is notable for a cystic structure within the right adnexa measures 3 cm previously 2.8 cm.  The endometrium is thickened measuring 1.9 cm previously 1.5 cm.  A complex solid mass is noted within the right lower quadrant of the abdomen measuring 4.6 x 3.2 cm this is a new finding.  There is a small 5 mm groundglass nodule identified within the posterior left upper lobe that is new from the previous examination.  She most recently saw Dr. Hinton [name] on December 14.  She had 3 additional cycles of paclitaxel and carboplatin since we last saw her.  She underwent a CT scan of the chest abdomen pelvis on December 11 which revealed further improvement.  The right mesenteric mass now measured 18 x 12 mm.  Previously it measured 21 x 15 mm.  Within the abdomen and pelvis, she continues to have a stable low-density right adnexal lesion measuring 3 cm.  The uterus was stable with mild endometrial thickening.  There is no lymphadenopathy, ascites or omental caking.  Her most recent Ca1 2 5 was 5.5.  At that time they had discussed a PARP inhibitor.  She has not yet  started that.  While I cannot directly see the images in reviewing her CT scan I do feel like she would be a surgical candidate I think it would be worthwhile trying to do this in a minimally invasive fashion.  This was discussed with her and her husband.  She ultimately would be very interested in proceeding with surgery however, she does not want to do it at this time as she is fearful of the Covid pandemic.  Other than persistent neuropathy that she has had from even her first round of chemotherapy she really denies any significant complaints.  Review of Systems:Review of Systems  Constitutional: Denies unintentional weight loss or weight gain Respiratory: Denies cough or shortness of breath  Cardiovascular: Denies chest pain or edema Gastrointestinal: Denies any change in her bowel or bladder habits.  Denies any abdominal pelvic pain or early satiety. Musculoskeletal: Negative.   Neurological: Peripheral neuropathy from chemotherapy does  not limit her activities of daily living Psychiatric/Behavioral: No changes   No Known Allergies  Past Medical History:  Diagnosis Date  . Anemia   . Arthritis    arthritis in back   . Ascites   . Breast cancer ([place])   . Collapsed vertebra ([place])    thoracic to lumbar vertebra   . Diabetes mellitus without complication ([place])   . GERD (gastroesophageal reflux disease)   . H/O hiatal hernia   . Hypercholesteremia   . Hypertension   . Lymph edema    left arm   . Shortness of breath    some due to ascites     Past Surgical History:  Procedure Laterality Date  . APPENDECTOMY  06/06/2012   Procedure: APPENDECTOMY;  Surgeon: Alvino Chapel, MD;  [facility]: [facility];  Service: Gynecology;;  . EYE SURGERY     as a child   . LAPAROTOMY  06/06/2012   Procedure: EXPLORATORY LAPAROTOMY;  Surgeon: Alvino Chapel, MD;  [facility]: [facility];  Service: Gynecology;  Laterality: N/A;  . MASTECTOMY, PARTIAL  Jan 2005   Left  . SALPINGOOPHORECTOMY   06/06/2012   Procedure: SALPINGO OOPHERECTOMY;  Surgeon: Alvino Chapel, MD;  [facility];  Service: Gynecology;  Laterality: Left;  . UTERINE FIBROID SURGERY  2003    Current Outpatient Medications  Medication Sig Dispense Refill  . cetirizine (ZYRTEC) 10 MG tablet Take 10 mg by mouth as needed.     [name] glimepiride (AMARYL) 1 MG tablet Take 1 mg by mouth daily with breakfast.     . losartan (COZAAR) 100 MG tablet Take 100 mg by mouth daily with breakfast.    . metFORMIN (GLUCOPHAGE-XR) 500 MG 24 hr tablet Take by mouth daily.     . metoprolol succinate (TOPROL-XL) 100 MG 24 hr tablet     . Multiple Vitamins-Minerals (CENTRUM SILVER ADULT 50+ PO) Take 1 tablet by mouth daily.    [name] RELION CONFIRM/MICRO TEST test strip     . simvastatin (ZOCOR) 40 MG tablet Take 40 mg by mouth every evening.    . vitamin B-12 (CYANOCOBALAMIN) 500 MCG tablet Take 500 mcg by mouth daily.     No current facility-administered medications for this visit.    Social History   Socioeconomic History  . Marital status: Married    Spouse name: Not on file  . Number of children: Not on file  . Years of education: Not on file  . Highest education level: Not on file  Occupational History  . Not on file  Tobacco Use  . Smoking status: Former Smoker    Quit date: 12/06/2002    Years since quitting: 16.9  . Smokeless tobacco: Never Used  Substance and Sexual Activity  . Alcohol use: No  . Drug use: No  . Sexual activity: Not Currently  Other Topics Concern  . Not on file  Social History Narrative  . Not on file   Social Determinants of Health   Financial Resource Strain:   . Difficulty of Paying Living Expenses: Not on file  Food Insecurity:   . Worried About Charity fundraiser in the Last Year: Not on file  . Ran Out of Food in the Last Year: Not on file  Transportation Needs:   . Lack of Transportation (Medical): Not on file  . Lack of Transportation (Non-Medical): Not on file  Physical  Activity:   . Days of Exercise per Week: Not on file  . Minutes  of Exercise per Session: Not on file  Stress:   . Feeling of Stress : Not on file  Social Connections:   . Frequency of Communication with Friends and Family: Not on file  . Frequency of Social Gatherings with Friends and Family: Not on file  . Attends Religious Services: Not on file  . Active Member of Clubs or Organizations: Not on file  . Attends Archivist Meetings: Not on file  . Marital Status: Not on file  Intimate Partner Violence:   . Fear of Current or Ex-Partner: Not on file  . Emotionally Abused: Not on file  . Physically Abused: Not on file  . Sexually Abused: Not on file    Family History  Problem Relation Age of Onset  . Colon cancer Mother   . Uterine cancer Sister   . Uterine cancer Daughter   . Liver cancer Paternal Grandfather    Physical Exam: Not applicable as phone visit.  [place] Zappia A., MD 11/21/2019, 1:12 PM

## 2019-11-20 ENCOUNTER — Ambulatory Visit
Admission: RE | Admit: 2019-11-20 | Discharge: 2019-11-20 | Disposition: A | Discharge status: Home / Self Care | Payer: Self-pay | Source: Ambulatory Visit | Attending: Gynecologic Oncology | Admitting: Gynecologic Oncology

## 2019-11-20 ENCOUNTER — Other Ambulatory Visit: Payer: Self-pay

## 2019-11-20 DIAGNOSIS — C5700 Malignant neoplasm of unspecified fallopian tube: Secondary | ICD-10-CM

## 2019-11-20 DIAGNOSIS — Z1501 Genetic susceptibility to malignant neoplasm of breast: Secondary | ICD-10-CM

## 2019-11-21 ENCOUNTER — Inpatient Hospital Stay: Payer: Medicare HMO | Attending: Gynecologic Oncology | Admitting: Gynecologic Oncology

## 2019-11-21 ENCOUNTER — Encounter: Payer: Self-pay | Admitting: Gynecologic Oncology

## 2019-11-21 DIAGNOSIS — C5700 Malignant neoplasm of unspecified fallopian tube: Secondary | ICD-10-CM

## 2019-11-21 DIAGNOSIS — Z1501 Genetic susceptibility to malignant neoplasm of breast: Secondary | ICD-10-CM | POA: Diagnosis not present

## 2019-12-05 ENCOUNTER — Telehealth: Payer: Self-pay

## 2019-12-05 NOTE — Telephone Encounter (Signed)
Office note faxed as requested by Dr. Remi Deter Nurse Amy.

## 2019-12-17 DIAGNOSIS — Z6831 Body mass index (BMI) 31.0-31.9, adult: Secondary | ICD-10-CM | POA: Diagnosis not present

## 2019-12-17 DIAGNOSIS — E785 Hyperlipidemia, unspecified: Secondary | ICD-10-CM | POA: Diagnosis not present

## 2019-12-17 DIAGNOSIS — Z7722 Contact with and (suspected) exposure to environmental tobacco smoke (acute) (chronic): Secondary | ICD-10-CM | POA: Diagnosis not present

## 2019-12-17 DIAGNOSIS — R69 Illness, unspecified: Secondary | ICD-10-CM | POA: Diagnosis not present

## 2019-12-17 DIAGNOSIS — E119 Type 2 diabetes mellitus without complications: Secondary | ICD-10-CM | POA: Diagnosis not present

## 2019-12-17 DIAGNOSIS — C7989 Secondary malignant neoplasm of other specified sites: Secondary | ICD-10-CM | POA: Diagnosis not present

## 2019-12-17 DIAGNOSIS — E669 Obesity, unspecified: Secondary | ICD-10-CM | POA: Diagnosis not present

## 2019-12-17 DIAGNOSIS — C569 Malignant neoplasm of unspecified ovary: Secondary | ICD-10-CM | POA: Diagnosis not present

## 2019-12-17 DIAGNOSIS — I10 Essential (primary) hypertension: Secondary | ICD-10-CM | POA: Diagnosis not present

## 2019-12-17 DIAGNOSIS — C55 Malignant neoplasm of uterus, part unspecified: Secondary | ICD-10-CM | POA: Diagnosis not present

## 2019-12-26 DIAGNOSIS — C7801 Secondary malignant neoplasm of right lung: Secondary | ICD-10-CM | POA: Diagnosis not present

## 2019-12-26 DIAGNOSIS — C5702 Malignant neoplasm of left fallopian tube: Secondary | ICD-10-CM | POA: Diagnosis not present

## 2019-12-26 DIAGNOSIS — Z853 Personal history of malignant neoplasm of breast: Secondary | ICD-10-CM | POA: Diagnosis not present

## 2019-12-26 DIAGNOSIS — E041 Nontoxic single thyroid nodule: Secondary | ICD-10-CM | POA: Diagnosis not present

## 2019-12-26 DIAGNOSIS — E119 Type 2 diabetes mellitus without complications: Secondary | ICD-10-CM | POA: Diagnosis not present

## 2020-01-09 DIAGNOSIS — E119 Type 2 diabetes mellitus without complications: Secondary | ICD-10-CM | POA: Diagnosis not present

## 2020-01-09 DIAGNOSIS — C5702 Malignant neoplasm of left fallopian tube: Secondary | ICD-10-CM | POA: Diagnosis not present

## 2020-01-09 DIAGNOSIS — C7801 Secondary malignant neoplasm of right lung: Secondary | ICD-10-CM | POA: Diagnosis not present

## 2020-01-09 DIAGNOSIS — E041 Nontoxic single thyroid nodule: Secondary | ICD-10-CM | POA: Diagnosis not present

## 2020-01-09 DIAGNOSIS — Z853 Personal history of malignant neoplasm of breast: Secondary | ICD-10-CM | POA: Diagnosis not present

## 2020-02-06 DIAGNOSIS — I10 Essential (primary) hypertension: Secondary | ICD-10-CM | POA: Diagnosis not present

## 2020-02-06 DIAGNOSIS — R9431 Abnormal electrocardiogram [ECG] [EKG]: Secondary | ICD-10-CM | POA: Diagnosis not present

## 2020-02-06 DIAGNOSIS — C5702 Malignant neoplasm of left fallopian tube: Secondary | ICD-10-CM | POA: Diagnosis not present

## 2020-02-06 DIAGNOSIS — I447 Left bundle-branch block, unspecified: Secondary | ICD-10-CM | POA: Diagnosis not present

## 2020-02-06 DIAGNOSIS — N83201 Unspecified ovarian cyst, right side: Secondary | ICD-10-CM | POA: Diagnosis not present

## 2020-02-06 DIAGNOSIS — I499 Cardiac arrhythmia, unspecified: Secondary | ICD-10-CM | POA: Diagnosis not present

## 2020-02-06 DIAGNOSIS — Z6829 Body mass index (BMI) 29.0-29.9, adult: Secondary | ICD-10-CM | POA: Diagnosis not present

## 2020-02-06 DIAGNOSIS — C5701 Malignant neoplasm of right fallopian tube: Secondary | ICD-10-CM | POA: Diagnosis not present

## 2020-02-06 DIAGNOSIS — Z853 Personal history of malignant neoplasm of breast: Secondary | ICD-10-CM | POA: Diagnosis not present

## 2020-02-08 ENCOUNTER — Telehealth: Payer: Self-pay | Admitting: *Deleted

## 2020-02-08 ENCOUNTER — Telehealth: Payer: Self-pay

## 2020-02-08 NOTE — Telephone Encounter (Signed)
Tracie Harrier APP called from [facility] and scheduled the patient for a follow up appt

## 2020-02-08 NOTE — Telephone Encounter (Signed)
[name] [name] stated that she wanted to cancel the appointment with Dr. Berline Lopes as she has not had a CT scan yet and will see Dr. Hinton Rao on 03-05-20. The APP with Dr. Hinton Rao jumped the gun.   She inquired about Dr. Alycia Rossetti.  Told her that Dr. Alycia Rossetti is only seeing patient in [place]. She is not coming to the [facility] office any longer.   Told [name] Nelson that after she sees Dr. Hinton Rao on 03-05-20 she can decide to see Dr. Berline Lopes in [facility] or go to [facility] to see Dr. Jarold Motto if she needs to see a gyn oncology surgeon for treatment planning. Appointment for 03-11-20 cancelled with Dr. Berline Lopes per patient's request.

## 2020-02-08 NOTE — Telephone Encounter (Signed)
Late entry below

## 2020-02-12 ENCOUNTER — Telehealth: Payer: Self-pay

## 2020-02-12 DIAGNOSIS — Z1231 Encounter for screening mammogram for malignant neoplasm of breast: Secondary | ICD-10-CM | POA: Diagnosis not present

## 2020-02-12 NOTE — Telephone Encounter (Signed)
RCVD REF from [facility].  Called pt to schedule. Pt states she saw Dr. Agustin Cree 05/2019 & was told no need for f/u appts. Pt states she will call our office .when ready to schedule f/u.

## 2020-03-03 DIAGNOSIS — C5702 Malignant neoplasm of left fallopian tube: Secondary | ICD-10-CM | POA: Diagnosis not present

## 2020-03-03 DIAGNOSIS — C50912 Malignant neoplasm of unspecified site of left female breast: Secondary | ICD-10-CM | POA: Diagnosis not present

## 2020-03-05 DIAGNOSIS — Z853 Personal history of malignant neoplasm of breast: Secondary | ICD-10-CM | POA: Diagnosis not present

## 2020-03-05 DIAGNOSIS — Z85820 Personal history of malignant melanoma of skin: Secondary | ICD-10-CM | POA: Diagnosis not present

## 2020-03-05 DIAGNOSIS — Z08 Encounter for follow-up examination after completed treatment for malignant neoplasm: Secondary | ICD-10-CM | POA: Diagnosis not present

## 2020-03-05 DIAGNOSIS — C5701 Malignant neoplasm of right fallopian tube: Secondary | ICD-10-CM | POA: Diagnosis not present

## 2020-03-11 ENCOUNTER — Ambulatory Visit: Payer: Medicare HMO | Admitting: Gynecologic Oncology

## 2020-03-11 ENCOUNTER — Telehealth: Payer: Self-pay | Admitting: Oncology

## 2020-03-11 ENCOUNTER — Other Ambulatory Visit: Payer: Self-pay

## 2020-03-11 ENCOUNTER — Encounter: Payer: Self-pay | Admitting: Gynecologic Oncology

## 2020-03-11 ENCOUNTER — Inpatient Hospital Stay: Payer: Medicare HMO | Attending: Gynecologic Oncology | Admitting: Gynecologic Oncology

## 2020-03-11 VITALS — BP 165/80 | HR 83 | Temp 98.2°F | Ht 65.0 in | Wt 181.5 lb

## 2020-03-11 DIAGNOSIS — Z8049 Family history of malignant neoplasm of other genital organs: Secondary | ICD-10-CM | POA: Insufficient documentation

## 2020-03-11 DIAGNOSIS — E7800 Pure hypercholesterolemia, unspecified: Secondary | ICD-10-CM | POA: Diagnosis not present

## 2020-03-11 DIAGNOSIS — Z7984 Long term (current) use of oral hypoglycemic drugs: Secondary | ICD-10-CM | POA: Diagnosis not present

## 2020-03-11 DIAGNOSIS — C786 Secondary malignant neoplasm of retroperitoneum and peritoneum: Secondary | ICD-10-CM | POA: Insufficient documentation

## 2020-03-11 DIAGNOSIS — E119 Type 2 diabetes mellitus without complications: Secondary | ICD-10-CM | POA: Diagnosis not present

## 2020-03-11 DIAGNOSIS — C5700 Malignant neoplasm of unspecified fallopian tube: Secondary | ICD-10-CM | POA: Insufficient documentation

## 2020-03-11 DIAGNOSIS — Z853 Personal history of malignant neoplasm of breast: Secondary | ICD-10-CM | POA: Diagnosis not present

## 2020-03-11 DIAGNOSIS — Z1501 Genetic susceptibility to malignant neoplasm of breast: Secondary | ICD-10-CM | POA: Diagnosis not present

## 2020-03-11 DIAGNOSIS — Z800 Family history of malignant neoplasm of digestive organs: Secondary | ICD-10-CM | POA: Diagnosis not present

## 2020-03-11 DIAGNOSIS — Z9079 Acquired absence of other genital organ(s): Secondary | ICD-10-CM | POA: Diagnosis not present

## 2020-03-11 DIAGNOSIS — I10 Essential (primary) hypertension: Secondary | ICD-10-CM | POA: Insufficient documentation

## 2020-03-11 DIAGNOSIS — Z79899 Other long term (current) drug therapy: Secondary | ICD-10-CM | POA: Diagnosis not present

## 2020-03-11 DIAGNOSIS — Z87891 Personal history of nicotine dependence: Secondary | ICD-10-CM | POA: Diagnosis not present

## 2020-03-11 DIAGNOSIS — Z90721 Acquired absence of ovaries, unilateral: Secondary | ICD-10-CM | POA: Diagnosis not present

## 2020-03-11 NOTE — Telephone Encounter (Signed)
Received message from Amy, RN at [facility] that they have received the office notes and recommendations and have forwarded them to Dr. Hinton Rao.

## 2020-03-11 NOTE — Telephone Encounter (Signed)
Left a message for Dr. Hinton Rao at [facility] with treatment recommendation per Dr. Berline Lopes.  Also faxed Dr. Charisse March office note from today to Dr. Hinton Rao.

## 2020-03-11 NOTE — Patient Instructions (Signed)
It was a pleasure meeting you today.  I will call you with the end of the week after you have had a chance to talk to her husband.  As we discussed today, I favor continuing with the oral chemotherapy for another 3 to 4 months, repeating a CT scan, and then having a discussion about proceeding with surgery at that time versus continuing with treatment.

## 2020-03-11 NOTE — Progress Notes (Signed)
Gynecologic Oncology Return Clinic Visit  03/11/20  Reason for Visit: Discussion regarding management plan in the setting of recurrent fallopian tube cancer  Treatment History: Oncology History  Fallopian tube cancer, BRCA2 positive ([place])  04/2012 Imaging    A CT scan was been obtained showing pleural effusion, significant abdominal ascites, an omental cake and carcinomatosis.   04/2012 Miscellaneous   CA 125 value was 656 units per mL CA 27.29 was 208 units per mL CEA was less than 0.3.     05/2012 Procedure   Pleurodesis  Cytology showed malignant cells consistent with adenocarcinoma.     06/06/2012 Surgery   Exploratory laparotomy with Dr. Fermin Schwab.  She was found to have diffuse metastatic high-grade serous carcinoma arising from the fallopian tube. At the time of surgery she had 6.5 L of ascites, the omentum was completely replaced by metastatic disease and diaphragms had metastatic disease on both surfaces. There was also considerable amount of disease in the pelvic peritoneum and bladder flap, precluding abdominal hysterectomy.. There is also considerable amount of metastatic disease around the appendix.   Appendectomy, total omentectomy, and left salpingo-oophorectomy were performed. The patient was suboptimally debulked. IIIC fallopian tube cancer   06/2012 - 10/2012 Chemotherapy   6 cycles of carboplatin and Taxol chemotherapy.    06/2012 Initial Diagnosis   Fallopian tube cancer, BRCA2 positive ([place])   10/2012 Imaging    CT scan  No evidence of disease in the abdomen or pelvis and only residual small right pleural effusion    10/2012 Miscellaneous   CA125 10/2012       10.6 01/2018           5.2 02/2019          23   04/2019 Imaging   CT    05/2019 Pathology Results   EMB Diagnosis Endometrium, biopsy - BENIGN ENDOMETRIOID POLYP - NEGATIVE FOR HYPERPLASIA OR MALIGNANCY   05/31/2019 - 07/2019 Chemotherapy   Carboplatin/taxol - 3 cycles CA-125 decreased to  13.6 prior to cycle 2    08/09/2019 Imaging   CT chest Trace right pleural fluid,  5 mm posterior left upper lobe pulmonary nodule stable from prior imaging on 05/11/2019 CT abdomen and pelvis Right pelvic small bowel mesenteric mass measures 2.1 x 1.5 cm on current examination versus 4.3 x 3.4 in the prior examination No new or progressive disease Persistent endometrial thickening and complex right ovarian cystic lesion Probable complex cyst in the lower pole the left kidney   08/2019 - 11/2019 Chemotherapy   C4-6 of carboplatin/taxol CA-125 decreased to 5.5    11/2019 -  Chemotherapy   Started Olaparib 324m/day maintenance CA-125 4.5 in March    03/03/2020 Imaging   CT chest, abdomen, pelvis: Similar to slight enlargement of right ovarian soft tissue density, measuring 3.6 x 2.7 cm (previously 3.2 x 2.6).  Slight decrease in size of mesenteric node versus soft tissue implant now measuring 1.4 x 1.2 cm.  No new sites of disease.     Interval History: Patient reports overall she is doing well.  She has been [place] the [place] with her only side effect being some intermittent loose bowels.  She has to take Imodium as needed on average once a week.  She notes having good energy as well as a good appetite.  She denies any nausea or emesis.  She denies early satiety or abdominal bloating.  She denies urinary symptoms.  She denies any vaginal bleeding or discharge.  Patient denies any  changes to her medications, medical history, family history, or surgical history since her last visit.  She has not received the Covid vaccine and has some concerns about it.  She is unsure whether she will ultimately get it.  Findings at the time of surgery in 2013: At the time of exploratory laparotomy the patient was found to have 6.5 L of straw-colored ascites. The omentum was entirely replaced by metastatic disease. The omentum was removed in its entirety. Both diaphragms had extensive metastatic  disease on the surfaces. There was also considerable amount of disease involving the peritoneum overlying the pelvic sidewalls and bladder flap. The left tube and ovary appeared relatively normal. The right tube and ovary were not enlarged although encased with peritoneal metastases. A considerable amount of metastatic disease around the appendix and terminal ileum area  At the completion surgical procedure the patient was suboptimally debulked with disease remaining on the diaphragm, pelvic peritoneum, and the small bowel mesentery (ileum)  Past Medical/Surgical History: Past Medical History:  Diagnosis Date  . Anemia   . Arthritis    arthritis in back   . Ascites   . Breast cancer ([place])   . Collapsed vertebra ([place])    thoracic to lumbar vertebra   . Diabetes mellitus without complication ([place])   . GERD (gastroesophageal reflux disease)   . H/O hiatal hernia   . Hypercholesteremia   . Hypertension   . Lymph edema    left arm   . Shortness of breath    some due to ascites     Past Surgical History:  Procedure Laterality Date  . APPENDECTOMY  06/06/2012   Procedure: APPENDECTOMY;  Surgeon: Alvino Chapel, MD;  Location: [facility];  Service: Gynecology;;  . EYE SURGERY     as a child   . LAPAROTOMY  06/06/2012   Procedure: EXPLORATORY LAPAROTOMY;  Surgeon: Alvino Chapel, MD;  Location: [facility];  Service: Gynecology;  Laterality: N/A;  . MASTECTOMY, PARTIAL  Jan 2005   Left  . SALPINGOOPHORECTOMY  06/06/2012   Procedure: SALPINGO OOPHERECTOMY;  Surgeon: Alvino Chapel, MD;  Location: [facility];  Service: Gynecology;  Laterality: Left;  . UTERINE FIBROID SURGERY  2003    Family History  Problem Relation Age of Onset  . Colon cancer Mother   . Uterine cancer Sister   . Uterine cancer Daughter   . Liver cancer Paternal Grandfather     Social History   Socioeconomic History  . Marital status: Married    Spouse name: Not on file  . Number of children: Not  on file  . Years of education: Not on file  . Highest education level: Not on file  Occupational History  . Not on file  Tobacco Use  . Smoking status: Former Smoker    Quit date: 12/06/2002    Years since quitting: 17.2  . Smokeless tobacco: Never Used  Substance and Sexual Activity  . Alcohol use: No  . Drug use: No  . Sexual activity: Not Currently  Other Topics Concern  . Not on file  Social History Narrative  . Not on file   Social Determinants of Health   Financial Resource Strain:   . Difficulty of Paying Living Expenses:   Food Insecurity:   . Worried About Charity fundraiser in the Last Year:   . Arboriculturist in the Last Year:   Transportation Needs:   . Film/video editor (Medical):   [name] Lack of Transportation (  Non-Medical):   Physical Activity:   . Days of Exercise per Week:   . Minutes of Exercise per Session:   Stress:   . Feeling of Stress :   Social Connections:   . Frequency of Communication with Friends and Family:   . Frequency of Social Gatherings with Friends and Family:   . Attends Religious Services:   . Active Member of Clubs or Organizations:   . Attends Archivist Meetings:   [name] Marital Status:     Current Medications:  Current Outpatient Medications:  .  cetirizine (ZYRTEC) 10 MG tablet, Take 10 mg by mouth as needed. , Disp: , Rfl:  .  glimepiride (AMARYL) 1 MG tablet, Take 1 mg by mouth daily with breakfast. , Disp: , Rfl:  .  losartan (COZAAR) 100 MG tablet, Take 100 mg by mouth daily with breakfast., Disp: , Rfl:  .  LYNPARZA 100 MG tablet, , Disp: , Rfl:  .  metFORMIN (GLUCOPHAGE-XR) 500 MG 24 hr tablet, Take by mouth daily. , Disp: , Rfl:  .  metoprolol succinate (TOPROL-XL) 100 MG 24 hr tablet, , Disp: , Rfl:  .  Multiple Vitamins-Minerals (CENTRUM SILVER ADULT 50+ PO), Take 1 tablet by mouth daily., Disp: , Rfl:  .  RELION CONFIRM/MICRO TEST test strip, , Disp: , Rfl:  .  simvastatin (ZOCOR) 40 MG tablet, Take 40 mg  by mouth every evening., Disp: , Rfl:  .  vitamin B-12 (CYANOCOBALAMIN) 500 MCG tablet, Take 500 mcg by mouth daily., Disp: , Rfl:   Review of Systems: + Diarrhea/loose stools, anxiety Denies appetite changes, fevers, chills, fatigue, unexplained weight changes. Denies hearing loss, neck lumps or masses, mouth sores, ringing in ears or voice changes. Denies cough or wheezing.  Denies shortness of breath. Denies chest pain or palpitations. Denies leg swelling. Denies abdominal distention, pain, blood in stools, constipation, nausea, vomiting, or early satiety. Denies pain with intercourse, dysuria, frequency, hematuria or incontinence. Denies hot flashes, pelvic pain, vaginal bleeding or vaginal discharge.   Denies joint pain, back pain or muscle pain/cramps. Denies itching, rash, or wounds. Denies dizziness, headaches, numbness or seizures. Denies swollen lymph nodes or glands, denies easy bruising or bleeding. Denies depression, confusion, or decreased concentration.  Physical Exam: BP (!) 165/80 (BP Location: Right Arm, Patient Position: Sitting)   Pulse 83   Temp 98.2 F (36.8 C) (Temporal)   Ht '5\' 5"'  (1.651 m)   Wt 181 lb 8 oz (82.3 kg)   SpO2 98%   BMI 30.20 kg/m  General: Alert, oriented, no acute distress. HEENT: Atraumatic, normocephalic, sclera anicteric. Chest: Clear to auscultation bilaterally.  No wheezes or rhonchi Cardiovascular: Regular rate and rhythm, no murmurs. Abdomen: Obese, soft, nontender.  Normoactive bowel sounds.  No masses or hepatosplenomegaly appreciated.  Well-healed scar. No fluid wave. Extremities: Grossly normal range of motion.  Warm, well perfused.  No edema bilaterally. Skin: No rashes or lesions noted. Lymphatics: No cervical, supraclavicular, or inguinal adenopathy. GU: Normal appearing external genitalia without erythema, excoriation, or lesions.  Speculum exam reveals moderately atrophic vaginal mucosa, no lesions or masses.  Cervix grossly  normal in appearance.  Bimanual exam reveals small, moderately mobile uterus.  No adnexal masses appreciated.  Rectovaginal exam confirms these findings and reveals no nodularity.  Laboratory & Radiologic Studies: None new  Assessment & Plan: Vernelle Wisner [id number] Jezek is a 69 y.o. woman with platinum sensitive recurrent fallopian tube cancer, initially diagnosed in 2013 who underwent suboptimal debulking followed by adjuvant  chemotherapy with recurrence detected in mid 2020.  The patient is now status post 6 cycles of carboplatin and paclitaxel and has been on maintenance Lynparza since completion of chemotherapy in December.  We reviewed her recent CT scan which shows overall stable disease.  There is been at most slight increase in the size of the right ovarian lesion and slight decrease in the size of the mesenteric small bowel nodule since her last CT scan in December.  Overall the patient is doing very well and tolerating PARP inhibitor therapy without significant side effects.  I am unable to appreciate the adnexal mass on pelvic exam and she has moderate mobility of her uterus.  I am somewhat concerned about her pelvis given suboptimal debulking at the time of her initial surgery back in 2013 and the decision to leave her right tube and ovary as well as uterus in situ.  We also discussed the possibility that a bowel resection could be necessary if this is truly a mass within the small bowel mesentery if its resection with compromised blood supply.  Thus, even a right salpingo-oophorectomy and resection of this mesenteric mass could result in a significant surgery with increased morbidity.  The patient has some concerns about the Covid vaccine and has not gotten vaccinated to date.  She also expresses concerns about undergoing surgery and being in the hospital given Covid.  I think these are very valid concerns and I share them with her.  She would like some time to talk to her husband.  I offered that  we could either move forward with scheduling surgery with an attempt at robotic assisted minimally invasive secondary debulking surgery versus continuing with PARP inhibitor maintenance therapy for 3-4 months followed by repeat CT imaging to assess disease status.  At the end of our discussion today, the patient is leaning towards continuing Harp inhibitor therapy with repeat imaging in several months but would like to talk to her husband before making a final decision.  I will call her at the end of the week to discuss further.  35 minutes of total time was spent for this patient encounter, including preparation, face-to-face counseling with the patient and coordination of care, and documentation of the encounter.  Jeral Pinch, MD  Division of Gynecologic Oncology  Department of Obstetrics and Gynecology  [facility]

## 2020-03-13 NOTE — Progress Notes (Deleted)
Gynecologic Oncology Telehealth Consult Note: Gyn-Onc  I connected with Crissy Mccreadie SEGBTDV VOHYW on 03/13/20 at  3:15 PM EDT by telephone and verified that I am speaking with the correct person using two identifiers.  I discussed the limitations, risks, security and privacy concerns of performing an evaluation and management service by telemedicine and the availability of in-person appointments. I also discussed with the patient that there may be a patient responsible charge related to this service. The patient expressed understanding and agreed to proceed.  Other persons participating in the visit and their role in the encounter: ***.  Patient's location: *** Provider's location: ***  Reason for Visit: ***  Treatment History: Oncology History  Fallopian tube cancer, BRCA2 positive ([place])  04/2012 Imaging    A CT scan was been obtained showing pleural effusion, significant abdominal ascites, an omental cake and carcinomatosis.   04/2012 Miscellaneous   CA 125 value was 656 units per mL CA 27.29 was 208 units per mL CEA was less than 0.3.     05/2012 Procedure   Pleurodesis  Cytology showed malignant cells consistent with adenocarcinoma.     06/06/2012 Surgery   Exploratory laparotomy with Dr. Fermin Schwab.  She was found to have diffuse metastatic high-grade serous carcinoma arising from the fallopian tube. At the time of surgery she had 6.5 L of ascites, the omentum was completely replaced by metastatic disease and diaphragms had metastatic disease on both surfaces. There was also considerable amount of disease in the pelvic peritoneum and bladder flap, precluding abdominal hysterectomy.. There is also considerable amount of metastatic disease around the appendix.   Appendectomy, total omentectomy, and left salpingo-oophorectomy were performed. The patient was suboptimally debulked. IIIC fallopian tube cancer   06/2012 - 10/2012 Chemotherapy   6 cycles of carboplatin and Taxol  chemotherapy.    06/2012 Initial Diagnosis   Fallopian tube cancer, BRCA2 positive ([place])   10/2012 Imaging    CT scan  No evidence of disease in the abdomen or pelvis and only residual small right pleural effusion    10/2012 Miscellaneous   CA125 10/2012       10.6 01/2018           5.2 02/2019          23   04/2019 Imaging   CT    05/2019 Pathology Results   EMB Diagnosis Endometrium, biopsy - BENIGN ENDOMETRIOID POLYP - NEGATIVE FOR HYPERPLASIA OR MALIGNANCY   05/31/2019 - 07/2019 Chemotherapy   Carboplatin/taxol - 3 cycles CA-125 decreased to 13.6 prior to cycle 2    08/09/2019 Imaging   CT chest Trace right pleural fluid,  5 mm posterior left upper lobe pulmonary nodule stable from prior imaging on 05/11/2019 CT abdomen and pelvis Right pelvic small bowel mesenteric mass measures 2.1 x 1.5 cm on current examination versus 4.3 x 3.4 in the prior examination No new or progressive disease Persistent endometrial thickening and complex right ovarian cystic lesion Probable complex cyst in the lower pole the left kidney   08/2019 - 11/2019 Chemotherapy   C4-6 of carboplatin/taxol CA-125 decreased to 5.5    11/2019 -  Chemotherapy   Started Olaparib 36m/day maintenance CA-125 4.5 in March    03/03/2020 Imaging   CT chest, abdomen, pelvis: Similar to slight enlargement of right ovarian soft tissue density, measuring 3.6 x 2.7 cm (previously 3.2 x 2.6).  Slight decrease in size of mesenteric node versus soft tissue implant now measuring 1.4 x 1.2 cm.  No new sites  of disease.     Interval History: ***  Past Medical/Surgical History: Past Medical History:  Diagnosis Date  . Anemia   . Arthritis    arthritis in back   . Ascites   . Breast cancer ([place])   . Collapsed vertebra ([place])    thoracic to lumbar vertebra   . Diabetes mellitus without complication ([place])   . GERD (gastroesophageal reflux disease)   . H/O hiatal hernia   . Hypercholesteremia   . Hypertension    . Lymph edema    left arm   . Shortness of breath    some due to ascites     Past Surgical History:  Procedure Laterality Date  . APPENDECTOMY  06/06/2012   Procedure: APPENDECTOMY;  Surgeon: Alvino Chapel, MD;  Location: [facility];  Service: Gynecology;;  . EYE SURGERY     as a child   . LAPAROTOMY  06/06/2012   Procedure: EXPLORATORY LAPAROTOMY;  Surgeon: Alvino Chapel, MD;  Location: [facility];  Service: Gynecology;  Laterality: N/A;  . MASTECTOMY, PARTIAL  Jan 2005   Left  . SALPINGOOPHORECTOMY  06/06/2012   Procedure: SALPINGO OOPHERECTOMY;  Surgeon: Alvino Chapel, MD;  Location: [facility];  Service: Gynecology;  Laterality: Left;  . UTERINE FIBROID SURGERY  2003    Family History  Problem Relation Age of Onset  . Colon cancer Mother   . Uterine cancer Sister   . Uterine cancer Daughter   . Liver cancer Paternal Grandfather     Social History   Socioeconomic History  . Marital status: Married    Spouse name: Not on file  . Number of children: Not on file  . Years of education: Not on file  . Highest education level: Not on file  Occupational History  . Not on file  Tobacco Use  . Smoking status: Former Smoker    Quit date: 12/06/2002    Years since quitting: 17.2  . Smokeless tobacco: Never Used  Substance and Sexual Activity  . Alcohol use: No  . Drug use: No  . Sexual activity: Not Currently  Other Topics Concern  . Not on file  Social History Narrative  . Not on file   Social Determinants of Health   Financial Resource Strain:   . Difficulty of Paying Living Expenses:   Food Insecurity:   . Worried About Charity fundraiser in the Last Year:   . Arboriculturist in the Last Year:   Transportation Needs:   . Film/video editor (Medical):   [name] Lack of Transportation (Non-Medical):   Physical Activity:   . Days of Exercise per Week:   . Minutes of Exercise per Session:   Stress:   . Feeling of Stress :   Social Connections:    . Frequency of Communication with Friends and Family:   . Frequency of Social Gatherings with Friends and Family:   . Attends Religious Services:   . Active Member of Clubs or Organizations:   . Attends Archivist Meetings:   [name] Marital Status:     Current Medications:  Current Outpatient Medications:  .  cetirizine (ZYRTEC) 10 MG tablet, Take 10 mg by mouth as needed. , Disp: , Rfl:  .  glimepiride (AMARYL) 1 MG tablet, Take 1 mg by mouth daily with breakfast. , Disp: , Rfl:  .  losartan (COZAAR) 100 MG tablet, Take 100 mg by mouth daily with breakfast., Disp: , Rfl:  .  LYNPARZA  100 MG tablet, , Disp: , Rfl:  .  metFORMIN (GLUCOPHAGE-XR) 500 MG 24 hr tablet, Take by mouth daily. , Disp: , Rfl:  .  metoprolol succinate (TOPROL-XL) 100 MG 24 hr tablet, , Disp: , Rfl:  .  Multiple Vitamins-Minerals (CENTRUM SILVER ADULT 50+ PO), Take 1 tablet by mouth daily., Disp: , Rfl:  .  RELION CONFIRM/MICRO TEST test strip, , Disp: , Rfl:  .  simvastatin (ZOCOR) 40 MG tablet, Take 40 mg by mouth every evening., Disp: , Rfl:  .  vitamin B-12 (CYANOCOBALAMIN) 500 MCG tablet, Take 500 mcg by mouth daily., Disp: , Rfl:   Review of Symptoms: Complete 10-system review is negative except ***as above in Interval History.  Physical Exam: There were no vitals taken for this visit. Note performed given limitations of phone visit.  Laboratory & Radiologic Studies: None new.  Assessment & Plan: [name] [name] [id number] [name] [name] is a 69 y.o. woman with Stage *** who presents for ***.  ***  I discussed the assessment and treatment plan with the patient. The patient was provided with an opportunity to ask questions and all were answered. The patient agreed with the plan and demonstrated an understanding of the instructions.   The patient was advised to call back or see an in-person evaluation if the symptoms worsen or if the condition fails to improve as anticipated.   *** minutes of total time was  spent for this patient encounter, including preparation, face-to-face counseling with the patient and coordination of care, and documentation of the encounter.   Jeral Pinch, MD  Division of Gynecologic Oncology  Department of Obstetrics and Gynecology  [facility]

## 2020-03-14 ENCOUNTER — Inpatient Hospital Stay: Payer: Medicare HMO | Admitting: Gynecologic Oncology

## 2020-03-14 ENCOUNTER — Telehealth: Payer: Self-pay | Admitting: Gynecologic Oncology

## 2020-03-14 NOTE — Telephone Encounter (Signed)
Called and talked to the patient as well as her husband.  They have reviewed things since her visit earlier this week and agreed that delaying surgery at this time, continuing PARP inhibitor for another several months, and repeat CT scan is how she would like to proceed.  I reviewed for the husband's benefit my concern that given the mesenteric nodule, she may require a small bowel resection and that due to disease burden and adhesions at the time of her initial surgery which led to leaving her uterus as well as right adnexa in situ, that surgery to remove the right tube and ovary as well as the mesenteric nodule may be quite complex with significant morbidity.  I will plan to have a phone visit with the patient after her repeat CT scan in 3-4 months.  Jeral Pinch MD Gynecologic Oncology

## 2020-04-04 DIAGNOSIS — C7801 Secondary malignant neoplasm of right lung: Secondary | ICD-10-CM | POA: Diagnosis not present

## 2020-04-04 DIAGNOSIS — Z853 Personal history of malignant neoplasm of breast: Secondary | ICD-10-CM | POA: Diagnosis not present

## 2020-04-04 DIAGNOSIS — N83201 Unspecified ovarian cyst, right side: Secondary | ICD-10-CM | POA: Diagnosis not present

## 2020-04-04 DIAGNOSIS — G629 Polyneuropathy, unspecified: Secondary | ICD-10-CM | POA: Diagnosis not present

## 2020-04-04 DIAGNOSIS — C5702 Malignant neoplasm of left fallopian tube: Secondary | ICD-10-CM | POA: Diagnosis not present

## 2020-05-01 DIAGNOSIS — C7801 Secondary malignant neoplasm of right lung: Secondary | ICD-10-CM | POA: Diagnosis not present

## 2020-05-01 DIAGNOSIS — N83201 Unspecified ovarian cyst, right side: Secondary | ICD-10-CM | POA: Diagnosis not present

## 2020-05-01 DIAGNOSIS — C5702 Malignant neoplasm of left fallopian tube: Secondary | ICD-10-CM | POA: Diagnosis not present

## 2020-05-01 DIAGNOSIS — G629 Polyneuropathy, unspecified: Secondary | ICD-10-CM | POA: Diagnosis not present

## 2020-05-01 DIAGNOSIS — Z853 Personal history of malignant neoplasm of breast: Secondary | ICD-10-CM | POA: Diagnosis not present

## 2020-05-30 DIAGNOSIS — C50912 Malignant neoplasm of unspecified site of left female breast: Secondary | ICD-10-CM | POA: Diagnosis not present

## 2020-05-30 DIAGNOSIS — C562 Malignant neoplasm of left ovary: Secondary | ICD-10-CM | POA: Diagnosis not present

## 2020-05-30 DIAGNOSIS — C5702 Malignant neoplasm of left fallopian tube: Secondary | ICD-10-CM | POA: Diagnosis not present

## 2020-06-02 DIAGNOSIS — E041 Nontoxic single thyroid nodule: Secondary | ICD-10-CM | POA: Diagnosis not present

## 2020-06-02 DIAGNOSIS — C7801 Secondary malignant neoplasm of right lung: Secondary | ICD-10-CM | POA: Diagnosis not present

## 2020-06-02 DIAGNOSIS — C5702 Malignant neoplasm of left fallopian tube: Secondary | ICD-10-CM | POA: Diagnosis not present

## 2020-06-02 DIAGNOSIS — C50419 Malignant neoplasm of upper-outer quadrant of unspecified female breast: Secondary | ICD-10-CM | POA: Diagnosis not present

## 2020-06-02 DIAGNOSIS — E119 Type 2 diabetes mellitus without complications: Secondary | ICD-10-CM | POA: Diagnosis not present

## 2020-07-02 DIAGNOSIS — E041 Nontoxic single thyroid nodule: Secondary | ICD-10-CM | POA: Diagnosis not present

## 2020-07-02 DIAGNOSIS — E119 Type 2 diabetes mellitus without complications: Secondary | ICD-10-CM | POA: Diagnosis not present

## 2020-07-02 DIAGNOSIS — Z853 Personal history of malignant neoplasm of breast: Secondary | ICD-10-CM | POA: Diagnosis not present

## 2020-07-02 DIAGNOSIS — C5702 Malignant neoplasm of left fallopian tube: Secondary | ICD-10-CM | POA: Diagnosis not present

## 2020-07-02 DIAGNOSIS — G629 Polyneuropathy, unspecified: Secondary | ICD-10-CM | POA: Diagnosis not present

## 2020-07-02 DIAGNOSIS — C50419 Malignant neoplasm of upper-outer quadrant of unspecified female breast: Secondary | ICD-10-CM | POA: Diagnosis not present

## 2020-07-02 DIAGNOSIS — C7801 Secondary malignant neoplasm of right lung: Secondary | ICD-10-CM | POA: Diagnosis not present

## 2020-08-01 DIAGNOSIS — E041 Nontoxic single thyroid nodule: Secondary | ICD-10-CM | POA: Diagnosis not present

## 2020-08-01 DIAGNOSIS — C50419 Malignant neoplasm of upper-outer quadrant of unspecified female breast: Secondary | ICD-10-CM | POA: Diagnosis not present

## 2020-08-01 DIAGNOSIS — Z8544 Personal history of malignant neoplasm of other female genital organs: Secondary | ICD-10-CM | POA: Diagnosis not present

## 2020-08-01 DIAGNOSIS — G629 Polyneuropathy, unspecified: Secondary | ICD-10-CM | POA: Diagnosis not present

## 2020-08-01 DIAGNOSIS — C119 Malignant neoplasm of nasopharynx, unspecified: Secondary | ICD-10-CM | POA: Diagnosis not present

## 2020-08-01 DIAGNOSIS — R1903 Right lower quadrant abdominal swelling, mass and lump: Secondary | ICD-10-CM | POA: Diagnosis not present

## 2020-08-01 DIAGNOSIS — Z853 Personal history of malignant neoplasm of breast: Secondary | ICD-10-CM | POA: Diagnosis not present

## 2020-08-01 DIAGNOSIS — C7801 Secondary malignant neoplasm of right lung: Secondary | ICD-10-CM | POA: Diagnosis not present

## 2020-08-01 DIAGNOSIS — N83201 Unspecified ovarian cyst, right side: Secondary | ICD-10-CM | POA: Diagnosis not present

## 2020-08-01 DIAGNOSIS — C5702 Malignant neoplasm of left fallopian tube: Secondary | ICD-10-CM | POA: Diagnosis not present

## 2020-08-20 DIAGNOSIS — E1169 Type 2 diabetes mellitus with other specified complication: Secondary | ICD-10-CM | POA: Diagnosis not present

## 2020-08-20 DIAGNOSIS — E7800 Pure hypercholesterolemia, unspecified: Secondary | ICD-10-CM | POA: Diagnosis not present

## 2020-08-20 DIAGNOSIS — G629 Polyneuropathy, unspecified: Secondary | ICD-10-CM | POA: Diagnosis not present

## 2020-08-20 DIAGNOSIS — Z0000 Encounter for general adult medical examination without abnormal findings: Secondary | ICD-10-CM | POA: Diagnosis not present

## 2020-08-20 DIAGNOSIS — Z6829 Body mass index (BMI) 29.0-29.9, adult: Secondary | ICD-10-CM | POA: Diagnosis not present

## 2020-08-20 DIAGNOSIS — E663 Overweight: Secondary | ICD-10-CM | POA: Diagnosis not present

## 2020-08-20 DIAGNOSIS — E1142 Type 2 diabetes mellitus with diabetic polyneuropathy: Secondary | ICD-10-CM | POA: Diagnosis not present

## 2020-08-20 DIAGNOSIS — I10 Essential (primary) hypertension: Secondary | ICD-10-CM | POA: Diagnosis not present

## 2020-08-20 DIAGNOSIS — E785 Hyperlipidemia, unspecified: Secondary | ICD-10-CM | POA: Diagnosis not present

## 2020-08-20 DIAGNOSIS — C5700 Malignant neoplasm of unspecified fallopian tube: Secondary | ICD-10-CM | POA: Diagnosis not present

## 2020-08-29 DIAGNOSIS — C5702 Malignant neoplasm of left fallopian tube: Secondary | ICD-10-CM | POA: Diagnosis not present

## 2020-08-29 DIAGNOSIS — E041 Nontoxic single thyroid nodule: Secondary | ICD-10-CM | POA: Diagnosis not present

## 2020-08-29 DIAGNOSIS — E786 Lipoprotein deficiency: Secondary | ICD-10-CM | POA: Diagnosis not present

## 2020-08-29 DIAGNOSIS — C7801 Secondary malignant neoplasm of right lung: Secondary | ICD-10-CM | POA: Diagnosis not present

## 2020-08-29 DIAGNOSIS — E119 Type 2 diabetes mellitus without complications: Secondary | ICD-10-CM | POA: Diagnosis not present

## 2020-08-29 DIAGNOSIS — Z853 Personal history of malignant neoplasm of breast: Secondary | ICD-10-CM | POA: Diagnosis not present

## 2020-09-25 DIAGNOSIS — R69 Illness, unspecified: Secondary | ICD-10-CM | POA: Diagnosis not present

## 2020-10-07 DIAGNOSIS — J929 Pleural plaque without asbestos: Secondary | ICD-10-CM | POA: Diagnosis not present

## 2020-10-07 DIAGNOSIS — K449 Diaphragmatic hernia without obstruction or gangrene: Secondary | ICD-10-CM | POA: Diagnosis not present

## 2020-10-07 DIAGNOSIS — C5702 Malignant neoplasm of left fallopian tube: Secondary | ICD-10-CM | POA: Diagnosis not present

## 2020-10-07 DIAGNOSIS — N83291 Other ovarian cyst, right side: Secondary | ICD-10-CM | POA: Diagnosis not present

## 2020-10-07 DIAGNOSIS — Z853 Personal history of malignant neoplasm of breast: Secondary | ICD-10-CM | POA: Diagnosis not present

## 2020-10-07 DIAGNOSIS — I700 Atherosclerosis of aorta: Secondary | ICD-10-CM | POA: Diagnosis not present

## 2020-10-15 ENCOUNTER — Inpatient Hospital Stay: Payer: Medicare HMO | Attending: Oncology | Admitting: Oncology

## 2020-10-15 ENCOUNTER — Other Ambulatory Visit: Payer: Self-pay

## 2020-10-15 VITALS — BP 186/83 | HR 81 | Temp 98.4°F | Resp 18 | Ht 64.0 in | Wt 181.1 lb

## 2020-10-15 DIAGNOSIS — Z1501 Genetic susceptibility to malignant neoplasm of breast: Secondary | ICD-10-CM | POA: Insufficient documentation

## 2020-10-15 DIAGNOSIS — G620 Drug-induced polyneuropathy: Secondary | ICD-10-CM | POA: Diagnosis not present

## 2020-10-15 DIAGNOSIS — Z1502 Genetic susceptibility to malignant neoplasm of ovary: Secondary | ICD-10-CM

## 2020-10-15 DIAGNOSIS — Z0001 Encounter for general adult medical examination with abnormal findings: Secondary | ICD-10-CM | POA: Diagnosis not present

## 2020-10-15 DIAGNOSIS — D649 Anemia, unspecified: Secondary | ICD-10-CM | POA: Diagnosis not present

## 2020-10-15 DIAGNOSIS — I972 Postmastectomy lymphedema syndrome: Secondary | ICD-10-CM | POA: Diagnosis not present

## 2020-10-15 DIAGNOSIS — T50905A Adverse effect of unspecified drugs, medicaments and biological substances, initial encounter: Secondary | ICD-10-CM

## 2020-10-15 DIAGNOSIS — T451X5A Adverse effect of antineoplastic and immunosuppressive drugs, initial encounter: Secondary | ICD-10-CM

## 2020-10-15 DIAGNOSIS — Z170 Estrogen receptor positive status [ER+]: Secondary | ICD-10-CM

## 2020-10-15 DIAGNOSIS — C5702 Malignant neoplasm of left fallopian tube: Secondary | ICD-10-CM | POA: Diagnosis not present

## 2020-10-15 DIAGNOSIS — Z1509 Genetic susceptibility to other malignant neoplasm: Secondary | ICD-10-CM

## 2020-10-15 DIAGNOSIS — C50412 Malignant neoplasm of upper-outer quadrant of left female breast: Secondary | ICD-10-CM | POA: Diagnosis not present

## 2020-10-15 DIAGNOSIS — C772 Secondary and unspecified malignant neoplasm of intra-abdominal lymph nodes: Secondary | ICD-10-CM

## 2020-10-15 LAB — CBC: RBC: 3.14 — AB (ref 3.87–5.11)

## 2020-10-15 LAB — HEPATIC FUNCTION PANEL
ALT: 28 (ref 7–35)
AST: 31 (ref 13–35)
Alkaline Phosphatase: 54 (ref 25–125)
Bilirubin, Total: 0.7

## 2020-10-15 LAB — CBC AND DIFFERENTIAL
HCT: 34 — AB (ref 36–46)
Hemoglobin: 11.8 — AB (ref 12.0–16.0)
Neutrophils Absolute: 5.18
Platelets: 166 (ref 150–399)
WBC: 7.1

## 2020-10-15 NOTE — Progress Notes (Signed)
[facility]  [address] [place],  [place]  [address] [phone]  Clinic Day:  10/15/2020  Referring physician: Venetia Maxon, Sharon Mt, *   This document serves as a record of services personally performed by Hosie Poisson, MD. It was created on their behalf by Curry,Lauren E, a trained medical scribe. The creation of this record is based on the scribe's personal observations and the provider's statements to them.   CHIEF COMPLAINT:  CC: Recurrent fallopian tube cancer  Current Treatment:  Olaparib 300 mg twice daily   HISTORY OF PRESENT ILLNESS:  [name] [name] NOBSJGG Meskill is a 69 y.o. female with a history of stage I hormone receptor positive left breast cancer diagnosed in 2005 at age 34.  This was treated with lumpectomy and axillary dissection.  She received adjuvant chemotherapy, radiation, and 5 years of tamoxifen.  She has never had any evidence of recurrence of her breast cancer.  In July 2013, she was found to have a stage IIIC left fallopian tube cancer with omental caking, as well as bilateral pleural effusions, which required thoracentesis.  She underwent suboptimal debulking left salpingo-oophorectomy, resection of the omental tumor and also appendectomy.  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  She received adjuvant chemotherapy with paclitaxel and carboplatin for 6 cycles.  The CA-125 normalized after 2 cycles and had remained normal.  She has also had anemia with previous iron deficiency and B12 deficiency.  She continues on oral B12 supplement.  She also has diabetes.  She has had chronic neuropathy since chemotherapy.  The patient has never had testing for hereditary breast and ovarian cancer.  The  patient had a melanoma of the right leg around age 59.  Her daughter had uterine cancer at age 77, a maternal cousin had an unknown female cancer at age 49, her paternal grandfather had liver cancer in his 41's, it is unclear if this was spread from another primary cancer, and a paternal uncle had an unknown type of malignancy in his 77's.  She meets current NCCN guidelines for genetic testing, but she has declined.   Her last CT imaging in 2015 was negative.  Bilateral screening mammogram in February 2020 did not reveal any evidence of malignancy.  She had persistent neuropathy, with tingling of the fingers and toes, which is stable.  She saw Dr. Agustin Cree for arrhythmias and EKG changes.  She also had an echocardiogram, which revealed normal left ventricular size with a low normal ejection fraction of 50-55%.   At that time, her CA 125 had increased from 5.4 to 23, and even though it was still in the normal range, we obtained CT imaging for further evaluation. CT scans unfortunately revealed recurrent disease with a 4.6 cm complex solid mass in the right lower quadrant.  There was also progressive endometrial thickening suspicious for underlying endometrial neoplasm.  There was a stable right adnexal cystic structure.  There is a small 5 mm ground-glass nodule in the posterior left upper lobe which was new from previous examination, but is too small to characterize.  She saw Dr. Skeet Latch who recommended 3 cycles of carboplatin/paclitaxel prior to consideration of surgery.  She told me the endometrial biopsy was negative, but patient says she also had biopsy of the ovary.  She received her 1st cycle  of carboplatin/paclitaxel on June 25th and tolerated this fairly well.  She had bone pain associated with Neulasta, which was not severe.  The CA 125 has decreased within normal range and was down to 13.6 prior to a 2nd cycle of chemotherapy. She completed 3 cycles of chemotherapy in August 2020.  CT scan in early  September 2020 revealed an improvement in the right pelvic small-bowel mesenteric mass, but it had not resolved.  There was no new or progressive disease.  There was a persistent complex right ovarian cystic lesion as well as persistent endometrial thickening.  Previous biopsy of the endometrium was benign.  She never did have a biopsy of the ovarian lesion.  We had referred her to Dr. Janie Morning for consideration of surgery and she did not recommend surgery at that time, but recommended 3 more cycles of the paclitaxel/carboplatin chemotherapy and then re-evaluate for possible surgery then.  CT chest, abdomen, and pelvis in December revealed further improvement of the right mesenteric mass, now measuring 18 x 12 mm, previously 21 x 15 mm.  The bilateral renal lesions and right renal scarring remained stable, as well stable mild endometrial thickening and right ovarian lesions.  The CA 125 was down to 5.5.    We recommended olaparib 300 mg twice daily for maintenance and she started this on January 8th with plans to repeat imaging in March.  Her CA 125 was down to 4.4 in February.  Bilateral screening mammogram from March 9th was clear.  CT chest, abdomen and pelvis from March 29th revealed similar to slight enlargement of a right ovarian soft tissue density lesion now measuring 3.6 x 2.7 cm, previously 3.2 x 2.6 cm.  There was a slight decrease in size of a mesenteric node or soft tissue implant measuring 1.4 x 1.2 cm, previously 1.8 x 1.2 cm.  No new sites of disease.  Her CA 125 was 6.3 in early March, 4.5 in late March, 4.7 in April and 4.5 in May.  She saw Dr. Georgiann Mccoy regarding possible debulking surgery and she felt we should continue the maintenance olaparib.  She had lower extremity neuropathy, so was instructed to use vitamin B6 100 mg twice daily.  CT chest, abdomen and pelvis from June 25th revealed a stable round indeterminate density lesion of the right adnexa measuring 3.6 x 2.9 cm as well  as a stable hypodense lesion of the liver measuring 5 mm.  Otherwise, imaging was negative.  She was seen by Dr. Jeral Pinch, who does not recommend surgery.   INTERVAL HISTORY:  Meaghan is here for routine follow up and states that she has been well.  She continues olaparib 300 mg BID.  CT chest, abdomen and pelvis from November 2nd revealed a stable exam with no new or progressive findings to suggest recurrent or metastatic disease.  The 5 mm posterior left upper lobe pulmonary nodule remains stable.  The small soft tissue nodule in the ileocolic mesentery shows continued decreased in size now measuring 1.1 x 0.9 cm, previously 1.4 x 1.2 cm.  Fairly stable 2.4 cm cystic lesion right ovary, only mildly progressed when comparing to 2015.  She states that she has been well other than some arthralgias.  Her  appetite is good, and she is eating well.  She denies fever, chills or other signs of infection.  She denies nausea, vomiting, bowel issues, or abdominal pain.  She denies sore throat, cough, dyspnea, or chest pain.   REVIEW OF SYSTEMS:  Review  of Systems  Musculoskeletal: Positive for arthralgias (mild).  All other systems reviewed and are negative.    VITALS:  Blood pressure (!) 186/83, pulse 81, temperature 98.4 F (36.9 C), temperature source Oral, resp. rate 18, height 5\' 4"  (1.626 m), weight 181 lb 1.6 oz (82.1 kg), SpO2 100 %.  Wt Readings from Last 3 Encounters:  10/15/20 181 lb 1.6 oz (82.1 kg)  03/11/20 181 lb 8 oz (82.3 kg)  08/16/19 184 lb (83.5 kg)    Body mass index is 31.09 kg/m.  Performance status (ECOG): 1 - Symptomatic but completely ambulatory  PHYSICAL EXAM:  Physical Exam Constitutional:      General: She is not in acute distress.    Appearance: Normal appearance. She is normal weight.  HENT:     Head: Normocephalic and atraumatic.  Eyes:     General: No scleral icterus.    Extraocular Movements: Extraocular movements intact.     Conjunctiva/sclera:  Conjunctivae normal.     Pupils: Pupils are equal, round, and reactive to light.  Cardiovascular:     Rate and Rhythm: Normal rate and regular rhythm.     Pulses: Normal pulses.     Heart sounds: Normal heart sounds. No murmur heard.  No friction rub. No gallop.   Pulmonary:     Effort: Pulmonary effort is normal. No respiratory distress.     Breath sounds: Normal breath sounds.  Abdominal:     General: Bowel sounds are normal. There is no distension.     Palpations: Abdomen is soft. There is no mass.     Tenderness: There is no abdominal tenderness.  Musculoskeletal:        General: Normal range of motion.     Cervical back: Normal range of motion and neck supple.     Right lower leg: No edema.     Left lower leg: Edema (trace) present.     Comments: She has some crepitation of the left shoulder  Lymphadenopathy:     Cervical: No cervical adenopathy.  Skin:    General: Skin is warm and dry.     Coloration: Skin is pale (mild).  Neurological:     General: No focal deficit present.     Mental Status: She is alert and oriented to person, place, and time. Mental status is at baseline.  Psychiatric:        Mood and Affect: Mood normal.        Behavior: Behavior normal.        Thought Content: Thought content normal.        Judgment: Judgment normal.     LABS:   CBC Latest Ref Rng & Units 06/07/2012 06/02/2012  WBC 4.0 - 10.5 K/uL 10.3 7.5  Hemoglobin 12.0 - 15.0 g/dL 9.2(L) 10.7(L)  Hematocrit 36 - 46 % 29.0(L) 34.3(L)  Platelets 150 - 400 K/uL 460(H) 484(H)   CMP Latest Ref Rng & Units 06/21/2014 06/08/2012 06/07/2012  Glucose 70 - 99 mg/dL - 114(H) 160(H)  BUN 7.0 - 26.0 mg/dL 11.3 6 5(L)  Creatinine 0.6 - 1.1 mg/dL 0.8 0.79 0.67  Sodium 135 - 145 mEq/L - 131(L) 128(L)  Potassium 3.5 - 5.1 mEq/L - 4.3 4.3  Chloride 96 - 112 mEq/L - 96 93(L)  CO2 19 - 32 mEq/L - 30 29  Calcium 8.4 - 10.5 mg/dL - 8.0(L) 7.9(L)  Total Protein 6.0 - 8.3 g/dL - - -  Total Bilirubin 0.3 - 1.2  mg/dL - - -  Alkaline Phos  39 - 117 U/L - - -  AST 0 - 37 U/L - - -  ALT 0 - 35 U/L - - -    No results found for: OZD664    STUDIES:   She underwent a CT chest, abdomen and pelvis with contrast on 10/07/2020 showing: 1.  Stable exam.  No new or progressive findings to suggest recurrent or metastatic disease in the chest, abdomen or pelvis. 2.  Stable 5 mm posterior left upper lobe pulmonary nodule. 3.  Small soft tissue nodule in the ileocolic mesentery shows continued decreased in size now measuring 1.1 x 0.9 cm, previously 1.4 x 1.2 cm.  Continued attention on follow up recommended. 4.  Stable 2.4 cm cystic lesion right ovary, similar to prior and stable comparing back to 05/11/2019.  Lesion is mildly progressed comparing to 2015.  Continued attention on follow-up recommended. 5.  Tiny hiatal hernia. 6.  Aortic atherosclerosis.   Allergies: No Known Allergies  Current Medications: Current Outpatient Medications  Medication Sig Dispense Refill  . pyridOXINE (VITAMIN B-6) 100 MG tablet 100 mg in the morning and at bedtime.    . cetirizine (ZYRTEC) 10 MG tablet Take 10 mg by mouth as needed.     [name] glimepiride (AMARYL) 2 MG tablet Take by mouth.    . losartan (COZAAR) 100 MG tablet Take 100 mg by mouth daily with breakfast.    . LYNPARZA 100 MG tablet     . metFORMIN (GLUCOPHAGE-XR) 500 MG 24 hr tablet Take by mouth daily.     . metoprolol succinate (TOPROL-XL) 100 MG 24 hr tablet     . Multiple Vitamins-Minerals (CENTRUM SILVER ADULT 50+ PO) Take 1 tablet by mouth daily.    [name] RELION CONFIRM/MICRO TEST test strip     . simvastatin (ZOCOR) 40 MG tablet Take 40 mg by mouth every evening.    . vitamin B-12 (CYANOCOBALAMIN) 500 MCG tablet Take 500 mcg by mouth daily.     No current facility-administered medications for this visit.     ASSESSMENT & PLAN:   Assessment:   1. Remote history of stage I hormone receptor positive breast cancer in 2005.  She remains without evidence of  recurrence.  Mammogram was clear from March 2021.  2. History of stage IIIC fallopian tube cancer with bilateral pleural effusions in July 2013.  She was treated with surgery and chemotherapy.  3. Right lower quadrant mass felt to represent recurrent fallopian tube cancer.  She had 6 cycles of carboplatin and paclitaxel.  She is now on olaparib 300 mg twice daily, a PARP inhibitor, for maintenance therapy.  We will plan to delay surgery at this time and continue her current treatment.    4. Endometrial thickening, endometrial biopsy was benign.    5. Right ovarian cyst, which is unchanged.   6. History of benign breast biopsy of the right breast in February 2016.  7. Neuropathy and paresthesias of the hands and feet, improved with vitamin B6 100 mg twice daily.  8.  Anemia, long standing.  This is most likely from prior chemotherapy and current olaparib.  Plan: CT imaging remains stable and so we will repeat imaging again in 4 months.  She knows to continue olaparib 300 mg twice daily.  I will call her with the results of her blood work.  We will plan to see her back in 2 month with a CBC, CMP and CA 125.  The patient and her daughter verbalize understanding of and agreement  to the plans discussed today. She knows to call the office should any new questions or concerns arise.    I provided 15 minutes (4:48 PM - 5:03 PM) of face-to-face time during this this encounter and > 50% was spent counseling as documented under my assessment and plan.    Derwood Kaplan, MD [facility] AT [facility] [address] [place] [address] Dept: [phone] Dept Fax: [phone]   I, Rita Ohara, am acting as scribe for Derwood Kaplan, MD  I have reviewed this report as typed by the medical scribe, and it is complete and accurate.

## 2020-10-16 NOTE — Progress Notes (Signed)
Patient had left a copy of a letter she received from [employer], saying she needed to sign a Medicare Reenrollment Letter for her [place] [place]. I called patient and we completed the reenrollment over the phone on [employer] website. I printed the paperwork off and let patient know I would mail her a copy. She will call me if she needs any further assistance.

## 2020-10-23 ENCOUNTER — Telehealth: Payer: Self-pay

## 2020-10-23 ENCOUNTER — Other Ambulatory Visit: Payer: Self-pay | Admitting: Hematology and Oncology

## 2020-10-23 LAB — BASIC METABOLIC PANEL
BUN: 15 (ref 4–21)
CO2: 28 — AB (ref 13–22)
Chloride: 100 (ref 99–108)
Creatinine: 1.2 — AB (ref 0.5–1.1)
Glucose: 84
Potassium: 3.8 (ref 3.4–5.3)
Sodium: 142 (ref 137–147)

## 2020-10-23 LAB — COMPREHENSIVE METABOLIC PANEL: Calcium: 10.5 (ref 8.7–10.7)

## 2020-10-23 LAB — CA 125: CA 125: 6.6

## 2020-10-23 NOTE — Telephone Encounter (Addendum)
[name],PA, also asked me to tell pt her Ca is borderline high, so if she is taking any calcium supplements, to stop.  I called pt, gave her all the lab results from [facility]. Pt does not take any calcium supplements, only a MTV.   ----- Message from [name] Pickles, PA-C sent at 10/23/2020  3:11 PM EST ----- Regarding: RE: lab results HgB up to 11.8, kidney function just slightly worse (creat 1.2, was 1.1) and CA 125 normal at 6.6. ----- Message ----- From: [name] Ponder, RN Sent: 10/23/2020   2:35 PM EST To: [name] Pickles, PA-C Subject: lab results                                    Pt req lab results

## 2020-12-08 ENCOUNTER — Encounter: Payer: Self-pay | Admitting: Oncology

## 2020-12-16 NOTE — Progress Notes (Signed)
[facility]  [address] [place],  [place]  [address] [phone]  Clinic Day:  12/17/2020  Referring physician: Venetia Maxon, Sharon Mt, *   This document serves as a record of services personally performed by Hosie Poisson, MD. It was created on their behalf by Curry,Lauren E, a trained medical scribe. The creation of this record is based on the scribe's personal observations and the provider's statements to them.   CHIEF COMPLAINT:  CC: Recurrent fallopian tube cancer  Current Treatment:  Olaparib 300 mg twice daily   HISTORY OF PRESENT ILLNESS:  [name] [name] is a 70 y.o. female with a history of stage I hormone receptor positive left breast cancer diagnosed in 2005 at age 77.  This was treated with lumpectomy and axillary dissection.  [name] [name] received adjuvant chemotherapy, radiation, and 5 years of tamoxifen.  [name] [name] has never had any evidence of recurrence of her breast cancer.  In July 2013, [name] [name] was found to have a stage IIIC left fallopian tube cancer with omental caking, as well as bilateral pleural effusions, which required thoracentesis.  [name] [name] underwent suboptimal debulking left salpingo-oophorectomy, resection of the omental tumor and also appendectomy.  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  [name] [name] received adjuvant chemotherapy with paclitaxel and carboplatin for 6 cycles.  The CA-125 normalized after 2 cycles and had remained normal.  [name] [name] has also had anemia with previous iron deficiency and B12 deficiency.  [name] [name] continues on oral B12 supplement.  [name] [name] also has diabetes.  [name] [name] has had chronic neuropathy since chemotherapy.  The patient has never had testing for hereditary breast and ovarian cancer.  The  patient had a melanoma of the right leg around age 68.  Her daughter had uterine cancer at age 76, a maternal cousin had an unknown female cancer at age 44, her paternal grandfather had liver cancer in his 62's, it is unclear if this was spread from another primary cancer, and a paternal uncle had an unknown type of malignancy in his 80's.  [name] [name] meets current NCCN guidelines for genetic testing, but [name] [name] has declined.   Her last CT imaging in 2015 was negative.  Bilateral screening mammogram in February 2020 did not reveal any evidence of malignancy.  [name] [name] had persistent neuropathy, with tingling of the fingers and toes, which is stable.  [name] [name] saw Dr. Agustin Cree for arrhythmias and EKG changes.  [name] [name] also had an echocardiogram, which revealed normal left ventricular size with a low normal ejection fraction of 50-55%.   At that time, her CA 125 had increased from 5.4 to 23, and even though it was still in the normal range, we obtained CT imaging for further evaluation. CT scans unfortunately revealed recurrent disease with a 4.6 cm complex solid mass in the right lower quadrant.  There was also progressive endometrial thickening suspicious for underlying endometrial neoplasm.  There was a stable right adnexal cystic structure.  There is a small 5 mm ground-glass nodule in the posterior left upper lobe which was new from previous examination, but is too small to characterize.  [name] [name] saw Dr. Skeet Latch who recommended 3 cycles of carboplatin/paclitaxel prior to consideration of surgery.  [name] [name] told me the endometrial biopsy was negative, but patient says [name] [name] also had biopsy of the ovary.  [name] [name] received her 1st cycle  of carboplatin/paclitaxel on June 25th and tolerated this fairly well.  [name] [name] had bone pain associated with Neulasta, which was not severe.  The CA 125 has decreased within normal range and was down to 13.6 prior to a 2nd cycle of chemotherapy. [name] [name] completed 3 cycles of chemotherapy in August 2020.  CT scan in early  September 2020 revealed an improvement in the right pelvic small-bowel mesenteric mass, but it had not resolved.  There was no new or progressive disease.  There was a persistent complex right ovarian cystic lesion as well as persistent endometrial thickening.  Previous biopsy of the endometrium was benign.  [name] [name] never did have a biopsy of the ovarian lesion.  We had referred her to Dr. Janie Morning for consideration of surgery and [name] [name] did not recommend surgery at that time, but recommended 3 more cycles of the paclitaxel/carboplatin chemotherapy and then re-evaluate for possible surgery then.  CT chest, abdomen, and pelvis in December revealed further improvement of the right mesenteric mass, now measuring 18 x 12 mm, previously 21 x 15 mm.  The bilateral renal lesions and right renal scarring remained stable, as well stable mild endometrial thickening and right ovarian lesions.  The CA 125 was down to 5.5.    We recommended olaparib 300 mg twice daily for maintenance and [name] [name] started this on January 8th with plans to repeat imaging in March.  Her CA 125 was down to 4.4 in February.  Bilateral screening mammogram from March 9th was clear.  CT chest, abdomen and pelvis from March 29th revealed similar to slight enlargement of a right ovarian soft tissue density lesion now measuring 3.6 x 2.7 cm, previously 3.2 x 2.6 cm.  There was a slight decrease in size of a mesenteric node or soft tissue implant measuring 1.4 x 1.2 cm, previously 1.8 x 1.2 cm.  No new sites of disease.  Her CA 125 was 6.3 in early March, 4.5 in late March, 4.7 in April and 4.5 in May.  [name] [name] saw Dr. Georgiann Mccoy regarding possible debulking surgery and [name] [name] felt we should continue the maintenance olaparib.  [name] [name] had lower extremity neuropathy, so was instructed to use vitamin B6 100 mg twice daily.  CT chest, abdomen and pelvis from June 25th revealed a stable round indeterminate density lesion of the right adnexa measuring 3.6 x 2.9 cm as well  as a stable hypodense lesion of the liver measuring 5 mm.  Otherwise, imaging was negative.  [name] [name] was seen by Dr. Jeral Pinch, who does not recommend surgery.  CT chest, abdomen and pelvis from November 2nd revealed a stable exam with no new or progressive findings to suggest recurrent or metastatic disease.  The 5 mm posterior left upper lobe pulmonary nodule remains stable.  The small soft tissue nodule in the ileocolic mesentery shows continued decreased in size now measuring 1.1 x 0.9 cm, previously 1.4 x 1.2 cm.  Fairly stable 2.4 cm cystic lesion right ovary, only mildly progressed when comparing to 2015.    INTERVAL HISTORY:  [name] [name] is here for routine follow up and states that [name] [name] has been well and denies complaints.  [name] [name] continues olaparib 300 mg BID without difficulty.  Blood counts and chemistries are unremarkable except for a hemoglobin of 11.4.  Her  appetite is good, and [name] [name] has lost 2 pounds since her last visit.  [name] [name] denies fever, chills or other signs of infection.  [name] [name] denies nausea, vomiting, bowel issues, or abdominal pain.  [name] [name] denies sore throat,  cough, dyspnea, or chest pain.  REVIEW OF SYSTEMS:  Review of Systems  Constitutional: Negative.   HENT:  Negative.   Eyes: Negative.   Respiratory: Negative.   Cardiovascular: Negative.   Gastrointestinal: Negative.   Endocrine: Negative.   Genitourinary: Negative.    Musculoskeletal: Negative.   Skin: Negative.   Neurological: Negative.   Hematological: Negative.   Psychiatric/Behavioral: Negative.      VITALS:  Blood pressure (!) 177/78, pulse 80, temperature 98.1 F (36.7 C), temperature source Oral, resp. rate 18, height 5\' 4"  (1.626 m), weight 179 lb (81.2 kg), SpO2 100 %.  Wt Readings from Last 3 Encounters:  12/17/20 179 lb (81.2 kg)  10/15/20 181 lb 1.6 oz (82.1 kg)  03/11/20 181 lb 8 oz (82.3 kg)    Body mass index is 30.73 kg/m.  Performance status (ECOG): 0 - Asymptomatic  PHYSICAL EXAM:  Physical  Exam Constitutional:      General: [name] [name] is not in acute distress.    Appearance: Normal appearance. [name] [name] is normal weight.  HENT:     Head: Normocephalic and atraumatic.  Eyes:     General: No scleral icterus.    Extraocular Movements: Extraocular movements intact.     Conjunctiva/sclera: Conjunctivae normal.     Pupils: Pupils are equal, round, and reactive to light.  Cardiovascular:     Rate and Rhythm: Normal rate and regular rhythm.     Pulses: Normal pulses.     Heart sounds: Normal heart sounds. No murmur heard. No friction rub. No gallop.   Pulmonary:     Effort: Pulmonary effort is normal. No respiratory distress.     Breath sounds: Normal breath sounds.  Abdominal:     General: Bowel sounds are normal. There is no distension.     Palpations: Abdomen is soft. There is no mass.     Tenderness: There is no abdominal tenderness.  Musculoskeletal:        General: Normal range of motion.     Cervical back: Normal range of motion and neck supple.     Right lower leg: No edema.     Left lower leg: No edema.  Lymphadenopathy:     Cervical: No cervical adenopathy.  Skin:    General: Skin is warm and dry.  Neurological:     General: No focal deficit present.     Mental Status: [name] [name] is alert and oriented to person, place, and time. Mental status is at baseline.  Psychiatric:        Mood and Affect: Mood normal.        Behavior: Behavior normal.        Thought Content: Thought content normal.        Judgment: Judgment normal.     LABS:   CBC Latest Ref Rng & Units 10/15/2020 06/07/2012 06/02/2012  WBC - 7.1 10.3 7.5  Hemoglobin 12.0 - 16.0 11.8(A) 9.2(L) 10.7(L)  Hematocrit 36 - 46 34(A) 29.0(L) 34.3(L)  Platelets 150 - 399 166 460(H) 484(H)   CMP Latest Ref Rng & Units 10/23/2020 10/15/2020 06/21/2014  Glucose 70 - 99 mg/dL - - -  BUN 4 - 21 15 - 11.3  Creatinine 0.5 - 1.1 1.2(A) - 0.8  Sodium 137 - 147 142 - -  Potassium 3.4 - 5.3 3.8 - -  Chloride 99 - 108 100 - -   CO2 13 - 22 28(A) - -  Calcium 8.7 - 10.7 10.5 - -  Total Protein 6.0 - 8.3 g/dL - - -  Total Bilirubin 0.3 - 1.2 mg/dL - - -  Alkaline Phos 25 - 125 - 54 -  AST 13 - 35 - 31 -  ALT 7 - 35 - 28 -    No results found for: [id number]    STUDIES:   No current studies  Allergies: No Known Allergies  Current Medications: Current Outpatient Medications  Medication Sig Dispense Refill  . cetirizine (ZYRTEC) 10 MG tablet Take 10 mg by mouth as needed.     [name] glimepiride (AMARYL) 2 MG tablet Take by mouth.    . losartan (COZAAR) 100 MG tablet Take 100 mg by mouth daily with breakfast.    . LYNPARZA 100 MG tablet     . metFORMIN (GLUCOPHAGE-XR) 500 MG 24 hr tablet Take by mouth daily.     . metoprolol succinate (TOPROL-XL) 100 MG 24 hr tablet     . Multiple Vitamins-Minerals (CENTRUM SILVER ADULT 50+ PO) Take 1 tablet by mouth daily.    [name] pyridOXINE (VITAMIN B-6) 100 MG tablet 100 mg in the morning and at bedtime.    [name] RELION CONFIRM/MICRO TEST test strip     . simvastatin (ZOCOR) 40 MG tablet Take 40 mg by mouth every evening.    . vitamin B-12 (CYANOCOBALAMIN) 500 MCG tablet Take 500 mcg by mouth daily.     No current facility-administered medications for this visit.     ASSESSMENT & PLAN:   Assessment:   1. Remote history of stage I hormone receptor positive breast cancer in 2005.  [name] [name] remains without evidence of recurrence.  Mammogram was clear from March 2021.  2. History of stage IIIC fallopian tube cancer with bilateral pleural effusions in July 2013.  [name] [name] was treated with surgery and chemotherapy.  3. Right lower quadrant mass felt to represent recurrent fallopian tube cancer.  [name] [name] had 6 cycles of carboplatin and paclitaxel.  [name] [name] is now on olaparib 300 mg twice daily, a PARP inhibitor, for maintenance therapy.  We will plan to delay surgery at this time and continue her current treatment.    4. Endometrial thickening, endometrial biopsy was benign.    5. Right ovarian cyst,  which is unchanged.   6. History of benign breast biopsy of the right breast in February 2016.  7. Neuropathy and paresthesias of the hands and feet, improved with vitamin B6 100 mg twice daily.  8.  Anemia, long standing.  This is most likely from prior chemotherapy and current olaparib.  Plan: [name] [name] knows to continue olaparib 300 mg twice daily.  We will plan to see her back in 2 month with a CBC, CMP, CA 125 and CT abdomen and pelvis.  The patient and her husband verbalize understanding of and agreement to the plans discussed today. [name] [name] knows to call the office should any new questions or concerns arise.    I provided 15 minutes of face-to-face time during this this encounter and > 50% was spent counseling as documented under my assessment and plan.    Derwood Kaplan, MD [facility] [address] [place] [address] Dept: [phone] Dept Fax: [phone]   I, Rita Ohara, am acting as scribe for Derwood Kaplan, MD  I have reviewed this report as typed by the medical scribe, and it is complete and accurate.

## 2020-12-17 ENCOUNTER — Inpatient Hospital Stay: Payer: Medicare HMO | Attending: Oncology

## 2020-12-17 ENCOUNTER — Other Ambulatory Visit: Payer: Self-pay

## 2020-12-17 ENCOUNTER — Other Ambulatory Visit: Payer: Self-pay | Admitting: Hematology and Oncology

## 2020-12-17 ENCOUNTER — Encounter: Payer: Self-pay | Admitting: Oncology

## 2020-12-17 ENCOUNTER — Inpatient Hospital Stay (INDEPENDENT_AMBULATORY_CARE_PROVIDER_SITE_OTHER): Payer: Medicare HMO | Admitting: Oncology

## 2020-12-17 VITALS — BP 177/78 | HR 80 | Temp 98.1°F | Resp 18 | Ht 64.0 in | Wt 179.0 lb

## 2020-12-17 DIAGNOSIS — Z1501 Genetic susceptibility to malignant neoplasm of breast: Secondary | ICD-10-CM | POA: Diagnosis not present

## 2020-12-17 DIAGNOSIS — Z1509 Genetic susceptibility to other malignant neoplasm: Secondary | ICD-10-CM

## 2020-12-17 DIAGNOSIS — C772 Secondary and unspecified malignant neoplasm of intra-abdominal lymph nodes: Secondary | ICD-10-CM

## 2020-12-17 DIAGNOSIS — C5702 Malignant neoplasm of left fallopian tube: Secondary | ICD-10-CM | POA: Diagnosis not present

## 2020-12-17 DIAGNOSIS — Z1502 Genetic susceptibility to malignant neoplasm of ovary: Secondary | ICD-10-CM

## 2020-12-17 DIAGNOSIS — C50412 Malignant neoplasm of upper-outer quadrant of left female breast: Secondary | ICD-10-CM

## 2020-12-17 DIAGNOSIS — Z170 Estrogen receptor positive status [ER+]: Secondary | ICD-10-CM | POA: Diagnosis not present

## 2020-12-17 LAB — CBC AND DIFFERENTIAL
HCT: 32 — AB (ref 36–46)
Hemoglobin: 11.4 — AB (ref 12.0–16.0)
Neutrophils Absolute: 4.37
Platelets: 143 — AB (ref 150–399)
WBC: 5.9

## 2020-12-17 LAB — BASIC METABOLIC PANEL
BUN: 11 (ref 4–21)
CO2: 29 — AB (ref 13–22)
Chloride: 103 (ref 99–108)
Creatinine: 1 (ref 0.5–1.1)
Glucose: 108
Potassium: 3.7 (ref 3.4–5.3)
Sodium: 139 (ref 137–147)

## 2020-12-17 LAB — HEPATIC FUNCTION PANEL
ALT: 24 (ref 7–35)
AST: 28 (ref 13–35)
Alkaline Phosphatase: 49 (ref 25–125)
Bilirubin, Total: 0.6

## 2020-12-17 LAB — CBC: RBC: 2.95 — AB (ref 3.87–5.11)

## 2020-12-17 LAB — COMPREHENSIVE METABOLIC PANEL
Albumin: 4.6 (ref 3.5–5.0)
Calcium: 10 (ref 8.7–10.7)

## 2020-12-18 LAB — CA 125: Cancer Antigen (CA) 125: 5 U/mL (ref 0.0–38.1)

## 2020-12-19 ENCOUNTER — Other Ambulatory Visit: Payer: Self-pay

## 2020-12-19 ENCOUNTER — Telehealth: Payer: Self-pay

## 2020-12-19 NOTE — Telephone Encounter (Signed)
Notified Amy. She states she will fix it in the system

## 2020-12-19 NOTE — Telephone Encounter (Signed)
-----   Message from Derwood Kaplan, MD sent at 12/19/2020 12:13 PM EST ----- Regarding: call pt Tell her CA 125 remains normal at 5.0

## 2020-12-19 NOTE — Telephone Encounter (Signed)
-----   Message from Derwood Kaplan, MD sent at 12/18/2020  2:57 PM EST ----- Regarding: lynparza Rx Written.  Levada Dy, would you fix the medlist - her dose is 150 mg, 2 bid (or can put 300 bid),not 100 mg ----- Message ----- From: Mort Sawyers Sent: 12/18/2020   2:08 PM EST To: Derwood Kaplan, MD  Will you write a new Rx for patients Lynparza, I need to send in to [facility]

## 2020-12-19 NOTE — Telephone Encounter (Signed)
Notified patient regarding lab results 

## 2021-02-03 DEATH — deceased

## 2021-02-16 ENCOUNTER — Ambulatory Visit: Payer: Medicare HMO | Admitting: Oncology
